# Patient Record
Sex: Female | Born: 1935 | Race: White | Hispanic: No | Marital: Married | Smoking: Never smoker
Health system: Southern US, Community
[De-identification: ages and names within clinical notes are randomized; demographics above are authoritative.]

## PROBLEM LIST (undated history)

## (undated) DIAGNOSIS — I350 Nonrheumatic aortic (valve) stenosis: Secondary | ICD-10-CM

## (undated) DIAGNOSIS — I480 Paroxysmal atrial fibrillation: Secondary | ICD-10-CM

## (undated) DIAGNOSIS — I2510 Atherosclerotic heart disease of native coronary artery without angina pectoris: Secondary | ICD-10-CM

## (undated) DIAGNOSIS — C50919 Malignant neoplasm of unspecified site of unspecified female breast: Secondary | ICD-10-CM

## (undated) DIAGNOSIS — I10 Essential (primary) hypertension: Secondary | ICD-10-CM

## (undated) HISTORY — DX: Atherosclerotic heart disease of native coronary artery without angina pectoris: I25.10

## (undated) HISTORY — DX: Paroxysmal atrial fibrillation: I48.0

## (undated) HISTORY — DX: Nonrheumatic aortic (valve) stenosis: I35.0

## (undated) HISTORY — PX: BREAST SURGERY: SHX581

## (undated) HISTORY — DX: Malignant neoplasm of unspecified site of unspecified female breast: C50.919

---

## 2014-01-16 ENCOUNTER — Inpatient Hospital Stay (HOSPITAL_COMMUNITY): Payer: No Typology Code available for payment source

## 2014-01-16 ENCOUNTER — Emergency Department (HOSPITAL_COMMUNITY): Payer: No Typology Code available for payment source

## 2014-01-16 ENCOUNTER — Encounter (HOSPITAL_COMMUNITY): Payer: Self-pay | Admitting: Emergency Medicine

## 2014-01-16 ENCOUNTER — Inpatient Hospital Stay (HOSPITAL_COMMUNITY)
Admission: EM | Admit: 2014-01-16 | Discharge: 2014-01-23 | DRG: 183 | Disposition: A | Discharge status: Home / Self Care | Payer: No Typology Code available for payment source | Attending: General Surgery | Admitting: General Surgery

## 2014-01-16 DIAGNOSIS — S272XXA Traumatic hemopneumothorax, initial encounter: Secondary | ICD-10-CM | POA: Diagnosis present

## 2014-01-16 DIAGNOSIS — S2249XA Multiple fractures of ribs, unspecified side, initial encounter for closed fracture: Principal | ICD-10-CM

## 2014-01-16 DIAGNOSIS — T07XXXA Unspecified multiple injuries, initial encounter: Secondary | ICD-10-CM | POA: Diagnosis present

## 2014-01-16 DIAGNOSIS — I10 Essential (primary) hypertension: Secondary | ICD-10-CM | POA: Diagnosis present

## 2014-01-16 DIAGNOSIS — R079 Chest pain, unspecified: Secondary | ICD-10-CM | POA: Diagnosis present

## 2014-01-16 DIAGNOSIS — S2242XA Multiple fractures of ribs, left side, initial encounter for closed fracture: Secondary | ICD-10-CM

## 2014-01-16 DIAGNOSIS — I2510 Atherosclerotic heart disease of native coronary artery without angina pectoris: Secondary | ICD-10-CM

## 2014-01-16 DIAGNOSIS — J939 Pneumothorax, unspecified: Secondary | ICD-10-CM

## 2014-01-16 DIAGNOSIS — E039 Hypothyroidism, unspecified: Secondary | ICD-10-CM | POA: Diagnosis present

## 2014-01-16 DIAGNOSIS — J811 Chronic pulmonary edema: Secondary | ICD-10-CM | POA: Diagnosis present

## 2014-01-16 DIAGNOSIS — Z79899 Other long term (current) drug therapy: Secondary | ICD-10-CM

## 2014-01-16 DIAGNOSIS — S2220XA Unspecified fracture of sternum, initial encounter for closed fracture: Secondary | ICD-10-CM

## 2014-01-16 DIAGNOSIS — D62 Acute posthemorrhagic anemia: Secondary | ICD-10-CM | POA: Diagnosis present

## 2014-01-16 DIAGNOSIS — S2241XA Multiple fractures of ribs, right side, initial encounter for closed fracture: Secondary | ICD-10-CM

## 2014-01-16 DIAGNOSIS — Y92410 Unspecified street and highway as the place of occurrence of the external cause: Secondary | ICD-10-CM

## 2014-01-16 DIAGNOSIS — E871 Hypo-osmolality and hyponatremia: Secondary | ICD-10-CM | POA: Diagnosis not present

## 2014-01-16 DIAGNOSIS — I4819 Other persistent atrial fibrillation: Secondary | ICD-10-CM

## 2014-01-16 DIAGNOSIS — Z7901 Long term (current) use of anticoagulants: Secondary | ICD-10-CM | POA: Diagnosis not present

## 2014-01-16 DIAGNOSIS — S2232XA Fracture of one rib, left side, initial encounter for closed fracture: Secondary | ICD-10-CM

## 2014-01-16 DIAGNOSIS — I959 Hypotension, unspecified: Secondary | ICD-10-CM | POA: Diagnosis present

## 2014-01-16 DIAGNOSIS — I4891 Unspecified atrial fibrillation: Secondary | ICD-10-CM | POA: Diagnosis present

## 2014-01-16 DIAGNOSIS — I359 Nonrheumatic aortic valve disorder, unspecified: Secondary | ICD-10-CM | POA: Diagnosis present

## 2014-01-16 DIAGNOSIS — J942 Hemothorax: Secondary | ICD-10-CM

## 2014-01-16 DIAGNOSIS — I482 Chronic atrial fibrillation, unspecified: Secondary | ICD-10-CM

## 2014-01-16 DIAGNOSIS — S271XXA Traumatic hemothorax, initial encounter: Secondary | ICD-10-CM

## 2014-01-16 DIAGNOSIS — S2239XA Fracture of one rib, unspecified side, initial encounter for closed fracture: Secondary | ICD-10-CM

## 2014-01-16 DIAGNOSIS — I350 Nonrheumatic aortic (valve) stenosis: Secondary | ICD-10-CM | POA: Diagnosis present

## 2014-01-16 HISTORY — DX: Essential (primary) hypertension: I10

## 2014-01-16 LAB — ETHANOL: Alcohol, Ethyl (B): <11 mg/dL (ref 0–11)

## 2014-01-16 LAB — PROTIME-INR
INR: 2.06 — ABNORMAL HIGH (ref 0.00–1.49)
Prothrombin Time: 23.2 seconds — ABNORMAL HIGH (ref 11.6–15.2)

## 2014-01-16 LAB — CBC
HCT: 28.5 % — ABNORMAL LOW (ref 36.0–46.0)
HEMATOCRIT: 28.3 % — AB (ref 36.0–46.0)
HEMOGLOBIN: 9.3 g/dL — AB (ref 12.0–15.0)
Hemoglobin: 9.3 g/dL — ABNORMAL LOW (ref 12.0–15.0)
MCH: 28.8 pg (ref 26.0–34.0)
MCH: 28.8 pg (ref 26.0–34.0)
MCHC: 32.9 g/dL (ref 30.0–36.0)
MCHC: 32.6 g/dL (ref 30.0–36.0)
MCV: 87.6 fL (ref 78.0–100.0)
MCV: 88.2 fL (ref 78.0–100.0)
PLATELETS: 345 10*3/uL (ref 150–400)
Platelets: 372 10*3/uL (ref 150–400)
RBC: 3.23 MIL/uL — AB (ref 3.87–5.11)
RBC: 3.23 MIL/uL — ABNORMAL LOW (ref 3.87–5.11)
RDW: 14.3 % (ref 11.5–15.5)
RDW: 14.4 % (ref 11.5–15.5)
WBC: 18 10*3/uL — ABNORMAL HIGH (ref 4.0–10.5)
WBC: 17.4 10*3/uL — AB (ref 4.0–10.5)

## 2014-01-16 LAB — COMPREHENSIVE METABOLIC PANEL
ALBUMIN: 3.3 g/dL — AB (ref 3.5–5.2)
ALT: 22 U/L (ref 0–35)
AST: 31 U/L (ref 0–37)
Alkaline Phosphatase: 90 U/L (ref 39–117)
Anion gap: 17 — ABNORMAL HIGH (ref 5–15)
BUN: 28 mg/dL — ABNORMAL HIGH (ref 6–23)
CO2: 20 meq/L (ref 19–32)
Calcium: 8.9 mg/dL (ref 8.4–10.5)
Chloride: 95 mEq/L — ABNORMAL LOW (ref 96–112)
Creatinine, Ser: 1.15 mg/dL — ABNORMAL HIGH (ref 0.50–1.10)
GFR calc Af Amer: 52 mL/min — ABNORMAL LOW (ref >90)
GFR calc non Af Amer: 45 mL/min — ABNORMAL LOW (ref >90)
Glucose, Bld: 110 mg/dL — ABNORMAL HIGH (ref 70–99)
Potassium: 5 mEq/L (ref 3.7–5.3)
Sodium: 132 mEq/L — ABNORMAL LOW (ref 137–147)
TOTAL PROTEIN: 6.4 g/dL (ref 6.0–8.3)
Total Bilirubin: 0.4 mg/dL (ref 0.3–1.2)

## 2014-01-16 LAB — MRSA PCR SCREENING: MRSA by PCR: NEGATIVE

## 2014-01-16 LAB — CDS SEROLOGY

## 2014-01-16 LAB — SAMPLE TO BLOOD BANK

## 2014-01-16 LAB — I-STAT CG4 LACTIC ACID, ED: Lactic Acid, Venous: 1.39 mmol/L (ref 0.5–2.2)

## 2014-01-16 MED ORDER — KCL IN DEXTROSE-NACL 20-5-0.45 MEQ/L-%-% IV SOLN
INTRAVENOUS | Status: DC
  Administered 2014-01-16 – 2014-01-17 (×2): via INTRAVENOUS
  Filled 2014-01-16 (×5): qty 1000

## 2014-01-16 MED ORDER — TIZANIDINE HCL 4 MG PO TABS
4.0000 mg | ORAL_TABLET | Freq: Every evening | ORAL | Status: DC | PRN
  Filled 2014-01-16: qty 1

## 2014-01-16 MED ORDER — OXYCODONE HCL 5 MG PO TABS
5.0000 mg | ORAL_TABLET | ORAL | Status: DC | PRN
  Administered 2014-01-16 – 2014-01-17 (×3): 5 mg via ORAL
  Filled 2014-01-16 (×3): qty 1

## 2014-01-16 MED ORDER — LETROZOLE 2.5 MG PO TABS
2.5000 mg | ORAL_TABLET | Freq: Every day | ORAL | Status: DC
  Administered 2014-01-17 – 2014-01-23 (×7): 2.5 mg via ORAL
  Filled 2014-01-16 (×7): qty 1

## 2014-01-16 MED ORDER — FUROSEMIDE 40 MG PO TABS
40.0000 mg | ORAL_TABLET | Freq: Every day | ORAL | Status: DC
  Administered 2014-01-17 – 2014-01-20 (×4): 40 mg via ORAL
  Filled 2014-01-16 (×3): qty 2
  Filled 2014-01-16: qty 1
  Filled 2014-01-16: qty 2

## 2014-01-16 MED ORDER — LEVOTHYROXINE SODIUM 100 MCG PO TABS
100.0000 ug | ORAL_TABLET | Freq: Every day | ORAL | Status: DC
  Administered 2014-01-17 – 2014-01-23 (×7): 100 ug via ORAL
  Filled 2014-01-16 (×9): qty 1

## 2014-01-16 MED ORDER — BIOTENE DRY MOUTH MT LIQD
15.0000 mL | Freq: Two times a day (BID) | OROMUCOSAL | Status: DC
  Administered 2014-01-16 – 2014-01-23 (×12): 15 mL via OROMUCOSAL

## 2014-01-16 MED ORDER — ATORVASTATIN CALCIUM 40 MG PO TABS
40.0000 mg | ORAL_TABLET | Freq: Every day | ORAL | Status: DC
  Administered 2014-01-17 – 2014-01-22 (×6): 40 mg via ORAL
  Filled 2014-01-16 (×9): qty 1

## 2014-01-16 MED ORDER — AMIODARONE HCL 100 MG PO TABS
100.0000 mg | ORAL_TABLET | Freq: Every day | ORAL | Status: DC
  Administered 2014-01-17 – 2014-01-18 (×2): 100 mg via ORAL
  Administered 2014-01-19: 50 mg via ORAL
  Filled 2014-01-16 (×3): qty 1

## 2014-01-16 MED ORDER — ONDANSETRON HCL 4 MG/2ML IJ SOLN: 4.0000 mg | Freq: Four times a day (QID) | INTRAMUSCULAR | Status: DC | PRN

## 2014-01-16 MED ORDER — GUAIFENESIN ER 600 MG PO TB12
600.0000 mg | ORAL_TABLET | Freq: Every day | ORAL | Status: DC | PRN
  Administered 2014-01-16: 600 mg via ORAL
  Filled 2014-01-16 (×2): qty 1

## 2014-01-16 MED ORDER — PANTOPRAZOLE SODIUM 40 MG PO TBEC
40.0000 mg | DELAYED_RELEASE_TABLET | Freq: Every day | ORAL | Status: DC
  Administered 2014-01-18 – 2014-01-23 (×6): 40 mg via ORAL
  Filled 2014-01-16 (×5): qty 1

## 2014-01-16 MED ORDER — HYDROMORPHONE HCL PF 1 MG/ML IJ SOLN: 0.5000 mg | INTRAMUSCULAR | Status: DC | PRN

## 2014-01-16 MED ORDER — ZOLPIDEM TARTRATE 5 MG PO TABS: 5.0000 mg | ORAL_TABLET | Freq: Once | ORAL | Status: DC

## 2014-01-16 MED ORDER — DILTIAZEM HCL ER COATED BEADS 360 MG PO CP24
360.0000 mg | ORAL_CAPSULE | Freq: Every day | ORAL | Status: DC
  Administered 2014-01-17 – 2014-01-20 (×4): 360 mg via ORAL
  Filled 2014-01-16 (×5): qty 1

## 2014-01-16 MED ORDER — IOHEXOL 300 MG/ML  SOLN
95.0000 mL | Freq: Once | INTRAMUSCULAR | Status: AC | PRN
  Administered 2014-01-16: 95 mL via INTRAVENOUS

## 2014-01-16 MED ORDER — ONDANSETRON HCL 4 MG PO TABS: 4.0000 mg | ORAL_TABLET | Freq: Four times a day (QID) | ORAL | Status: DC | PRN

## 2014-01-16 MED ORDER — PANTOPRAZOLE SODIUM 40 MG IV SOLR
40.0000 mg | Freq: Every day | INTRAVENOUS | Status: DC
  Administered 2014-01-16 – 2014-01-17 (×2): 40 mg via INTRAVENOUS
  Filled 2014-01-16 (×2): qty 40
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
## 2014-01-16 NOTE — ED Notes (Signed)
Dr Zavitz at bedside  

## 2014-01-16 NOTE — ED Notes (Signed)
Pt returned from radiology.

## 2014-01-17 ENCOUNTER — Inpatient Hospital Stay (HOSPITAL_COMMUNITY): Payer: No Typology Code available for payment source

## 2014-01-17 DIAGNOSIS — I359 Nonrheumatic aortic valve disorder, unspecified: Secondary | ICD-10-CM

## 2014-01-17 DIAGNOSIS — I2510 Atherosclerotic heart disease of native coronary artery without angina pectoris: Secondary | ICD-10-CM

## 2014-01-17 LAB — HEMOGLOBIN AND HEMATOCRIT, BLOOD
HCT: 23.4 % — ABNORMAL LOW (ref 36.0–46.0)
Hemoglobin: 7.6 g/dL — ABNORMAL LOW (ref 12.0–15.0)

## 2014-01-17 LAB — BASIC METABOLIC PANEL
ANION GAP: 15 (ref 5–15)
Anion gap: 12 (ref 5–15)
BUN: 26 mg/dL — AB (ref 6–23)
BUN: 28 mg/dL — ABNORMAL HIGH (ref 6–23)
CALCIUM: 8.3 mg/dL — AB (ref 8.4–10.5)
CHLORIDE: 94 meq/L — AB (ref 96–112)
CHLORIDE: 97 meq/L (ref 96–112)
CO2: 19 meq/L (ref 19–32)
CO2: 19 meq/L (ref 19–32)
CREATININE: 0.89 mg/dL (ref 0.50–1.10)
Calcium: 7.5 mg/dL — ABNORMAL LOW (ref 8.4–10.5)
Creatinine, Ser: 0.98 mg/dL (ref 0.50–1.10)
GFR calc Af Amer: 71 mL/min — ABNORMAL LOW (ref >90)
GFR calc Af Amer: 63 mL/min — ABNORMAL LOW (ref >90)
GFR calc non Af Amer: 61 mL/min — ABNORMAL LOW (ref >90)
GFR calc non Af Amer: 55 mL/min — ABNORMAL LOW (ref >90)
Glucose, Bld: 542 mg/dL — ABNORMAL HIGH (ref 70–99)
Glucose, Bld: 124 mg/dL — ABNORMAL HIGH (ref 70–99)
Potassium: 7.2 mEq/L (ref 3.7–5.3)
Potassium: 5.4 mEq/L — ABNORMAL HIGH (ref 3.7–5.3)
SODIUM: 131 meq/L — AB (ref 137–147)
Sodium: 125 mEq/L — ABNORMAL LOW (ref 137–147)

## 2014-01-17 LAB — CBC
HCT: 26.3 % — ABNORMAL LOW (ref 36.0–46.0)
HEMATOCRIT: 22.9 % — AB (ref 36.0–46.0)
Hemoglobin: 7.5 g/dL — ABNORMAL LOW (ref 12.0–15.0)
Hemoglobin: 8.7 g/dL — ABNORMAL LOW (ref 12.0–15.0)
MCH: 28.3 pg (ref 26.0–34.0)
MCH: 29.1 pg (ref 26.0–34.0)
MCHC: 32.8 g/dL (ref 30.0–36.0)
MCHC: 33.1 g/dL (ref 30.0–36.0)
MCV: 86.4 fL (ref 78.0–100.0)
MCV: 88 fL (ref 78.0–100.0)
PLATELETS: 315 10*3/uL (ref 150–400)
Platelets: 308 10*3/uL (ref 150–400)
RBC: 2.65 MIL/uL — AB (ref 3.87–5.11)
RBC: 2.99 MIL/uL — ABNORMAL LOW (ref 3.87–5.11)
RDW: 14.5 % (ref 11.5–15.5)
RDW: 14.5 % (ref 11.5–15.5)
WBC: 8.2 10*3/uL (ref 4.0–10.5)
WBC: 7.9 10*3/uL (ref 4.0–10.5)

## 2014-01-17 MED ORDER — FUROSEMIDE 10 MG/ML IJ SOLN
20.0000 mg | Freq: Once | INTRAMUSCULAR | Status: AC
  Administered 2014-01-17: 20 mg via INTRAVENOUS
  Filled 2014-01-17: qty 2

## 2014-01-17 MED ORDER — DIPHENHYDRAMINE HCL 12.5 MG/5ML PO LIQD
12.5000 mg | ORAL | Status: DC | PRN
  Administered 2014-01-18: 12.5 mg via ORAL
  Filled 2014-01-17: qty 5

## 2014-01-17 MED ORDER — OXYCODONE HCL 5 MG PO TABS
5.0000 mg | ORAL_TABLET | ORAL | Status: DC | PRN
  Administered 2014-01-17: 10 mg via ORAL
  Administered 2014-01-17: 5 mg via ORAL
  Administered 2014-01-18 – 2014-01-19 (×4): 10 mg via ORAL
  Administered 2014-01-19: 5 mg via ORAL
  Administered 2014-01-20 – 2014-01-21 (×5): 10 mg via ORAL
  Administered 2014-01-22: 5 mg via ORAL
  Administered 2014-01-22: 10 mg via ORAL
  Administered 2014-01-22: 5 mg via ORAL
  Administered 2014-01-23 (×2): 10 mg via ORAL
  Filled 2014-01-17: qty 1
  Filled 2014-01-17 (×5): qty 2
  Filled 2014-01-17: qty 1
  Filled 2014-01-17: qty 2
  Filled 2014-01-17: qty 1
  Filled 2014-01-17 (×3): qty 2
  Filled 2014-01-17: qty 1
  Filled 2014-01-17 (×4): qty 2

## 2014-01-17 MED ORDER — IPRATROPIUM-ALBUTEROL 0.5-2.5 (3) MG/3ML IN SOLN: 3.0000 mL | RESPIRATORY_TRACT | Status: DC

## 2014-01-17 MED ORDER — IPRATROPIUM-ALBUTEROL 0.5-2.5 (3) MG/3ML IN SOLN: 3.0000 mL | RESPIRATORY_TRACT | Status: DC | PRN

## 2014-01-17 MED ORDER — GUAIFENESIN ER 600 MG PO TB12
1200.0000 mg | ORAL_TABLET | Freq: Two times a day (BID) | ORAL | Status: DC
  Administered 2014-01-17 – 2014-01-23 (×13): 1200 mg via ORAL
  Filled 2014-01-17 (×15): qty 2

## 2014-01-17 NOTE — Progress Notes (Signed)
CRITICAL VALUE ALERT  Critical value received:  Potassium  Date of notification:  01/17/14  Time of notification:  7076  Critical value read back:Yes.    Nurse who received alert:  Archie Endo, RN  MD notified (1st page):  Hulen Skains  Time of first page:  [phone]  Responding MD:  Hulen Skains  Time MD responded:  0408  Orders received.  Will continue to monitor pt closely.

## 2014-01-17 NOTE — Progress Notes (Signed)
UR completed.  Sherryl Valido, RN BSN [facility] CCM Trauma/Neuro ICU Case Manager [phone]  
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
## 2014-01-18 ENCOUNTER — Inpatient Hospital Stay (HOSPITAL_COMMUNITY): Payer: No Typology Code available for payment source

## 2014-01-18 LAB — CBC
HCT: 25.1 % — ABNORMAL LOW (ref 36.0–46.0)
HEMOGLOBIN: 8.2 g/dL — AB (ref 12.0–15.0)
MCH: 28.2 pg (ref 26.0–34.0)
MCHC: 32.7 g/dL (ref 30.0–36.0)
MCV: 86.3 fL (ref 78.0–100.0)
Platelets: 303 10*3/uL (ref 150–400)
RBC: 2.91 MIL/uL — AB (ref 3.87–5.11)
RDW: 14.4 % (ref 11.5–15.5)
WBC: 8.2 10*3/uL (ref 4.0–10.5)

## 2014-01-18 MED ORDER — DIPHENHYDRAMINE HCL 12.5 MG/5ML PO ELIX
12.5000 mg | ORAL_SOLUTION | Freq: Four times a day (QID) | ORAL | Status: DC | PRN
  Administered 2014-01-18 – 2014-01-21 (×2): 12.5 mg via ORAL
  Filled 2014-01-18: qty 10
  Filled 2014-01-18: qty 5
  Filled 2014-01-18: qty 10

## 2014-01-18 MED ORDER — DIPHENHYDRAMINE HCL 50 MG/ML IJ SOLN
INTRAMUSCULAR | Status: AC
  Administered 2014-01-18: 12.5 mg via INTRAVENOUS
  Filled 2014-01-18: qty 1

## 2014-01-18 MED ORDER — DIPHENHYDRAMINE HCL 50 MG/ML IJ SOLN
12.5000 mg | Freq: Four times a day (QID) | INTRAMUSCULAR | Status: DC | PRN
  Administered 2014-01-18: 12.5 mg via INTRAVENOUS

## 2014-01-18 MED ORDER — CAMPHOR-MENTHOL 0.5-0.5 % EX LOTN
TOPICAL_LOTION | CUTANEOUS | Status: DC | PRN
  Filled 2014-01-18: qty 222

## 2014-01-18 MED ORDER — FUROSEMIDE 10 MG/ML IJ SOLN
40.0000 mg | Freq: Once | INTRAMUSCULAR | Status: AC
  Administered 2014-01-18: 40 mg via INTRAVENOUS
  Filled 2014-01-18: qty 4
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
## 2014-01-18 NOTE — Progress Notes (Signed)
OT Cancellation Note  Patient Details Name: Kristen Gutierrez MRN: [id number] DOB: 03/22/1936   Cancelled Treatment:    Reason Eval/Treat Not Completed: Patient declined, no reason specified (reports too fatigue, family reports confusion). OT to reattempt as appropriate.   Peri Maris Pager: [phone]  01/18/2014, 1:38 PM

## 2014-01-19 ENCOUNTER — Inpatient Hospital Stay (HOSPITAL_COMMUNITY): Payer: No Typology Code available for payment source

## 2014-01-19 DIAGNOSIS — S2249XA Multiple fractures of ribs, unspecified side, initial encounter for closed fracture: Secondary | ICD-10-CM | POA: Diagnosis not present

## 2014-01-19 DIAGNOSIS — R079 Chest pain, unspecified: Secondary | ICD-10-CM | POA: Diagnosis not present

## 2014-01-19 DIAGNOSIS — J9819 Other pulmonary collapse: Secondary | ICD-10-CM

## 2014-01-19 LAB — CBC
HCT: 23.2 % — ABNORMAL LOW (ref 36.0–46.0)
Hemoglobin: 7.5 g/dL — ABNORMAL LOW (ref 12.0–15.0)
MCH: 28 pg (ref 26.0–34.0)
MCHC: 32.3 g/dL (ref 30.0–36.0)
MCV: 86.6 fL (ref 78.0–100.0)
PLATELETS: 281 10*3/uL (ref 150–400)
RBC: 2.68 MIL/uL — ABNORMAL LOW (ref 3.87–5.11)
RDW: 14.4 % (ref 11.5–15.5)
WBC: 11.9 10*3/uL — AB (ref 4.0–10.5)

## 2014-01-19 LAB — ABO/RH: ABO/RH(D): O POS

## 2014-01-19 LAB — BASIC METABOLIC PANEL
ANION GAP: 17 — AB (ref 5–15)
BUN: 40 mg/dL — ABNORMAL HIGH (ref 6–23)
CHLORIDE: 89 meq/L — AB (ref 96–112)
CO2: 19 mEq/L (ref 19–32)
Calcium: 8.3 mg/dL — ABNORMAL LOW (ref 8.4–10.5)
Creatinine, Ser: 1.32 mg/dL — ABNORMAL HIGH (ref 0.50–1.10)
GFR calc non Af Amer: 38 mL/min — ABNORMAL LOW (ref >90)
GFR, EST AFRICAN AMERICAN: 44 mL/min — AB (ref >90)
Glucose, Bld: 99 mg/dL (ref 70–99)
POTASSIUM: 5.4 meq/L — AB (ref 3.7–5.3)
Sodium: 125 mEq/L — ABNORMAL LOW (ref 137–147)

## 2014-01-19 LAB — PREPARE RBC (CROSSMATCH)

## 2014-01-19 LAB — PRO B NATRIURETIC PEPTIDE: PRO B NATRI PEPTIDE: 2517 pg/mL — AB (ref 0–450)

## 2014-01-19 MED ORDER — FUROSEMIDE 10 MG/ML IJ SOLN
20.0000 mg | Freq: Once | INTRAMUSCULAR | Status: AC
  Administered 2014-01-19: 20 mg via INTRAVENOUS
  Filled 2014-01-19: qty 2

## 2014-01-19 MED ORDER — AMIODARONE HCL 100 MG PO TABS
50.0000 mg | ORAL_TABLET | Freq: Two times a day (BID) | ORAL | Status: DC
  Administered 2014-01-19 – 2014-01-21 (×4): 50 mg via ORAL
  Filled 2014-01-19 (×7): qty 1
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
## 2014-01-19 NOTE — Progress Notes (Signed)
OT Cancellation Note  Patient Details Name: Kristen Gutierrez MRN: [id number] DOB: 12/23/1935   Cancelled Treatment:    Reason Eval/Treat Not Completed: Patient not medically ready (OT to hold until after one unit of blood )  Peri Maris Pager: [phone]  01/19/2014, 8:57 AM
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
## 2014-01-20 ENCOUNTER — Inpatient Hospital Stay (HOSPITAL_COMMUNITY): Payer: No Typology Code available for payment source

## 2014-01-20 DIAGNOSIS — E871 Hypo-osmolality and hyponatremia: Secondary | ICD-10-CM

## 2014-01-20 LAB — TYPE AND SCREEN
ABO/RH(D): O POS
ANTIBODY SCREEN: NEGATIVE
Unit division: 0

## 2014-01-20 LAB — CBC WITH DIFFERENTIAL/PLATELET
BASOS ABS: 0 10*3/uL (ref 0.0–0.1)
BASOS PCT: 0 % (ref 0–1)
Eosinophils Absolute: 0.1 10*3/uL (ref 0.0–0.7)
Eosinophils Relative: 1 % (ref 0–5)
HCT: 23.3 % — ABNORMAL LOW (ref 36.0–46.0)
Hemoglobin: 8 g/dL — ABNORMAL LOW (ref 12.0–15.0)
Lymphocytes Relative: 6 % — ABNORMAL LOW (ref 12–46)
Lymphs Abs: 0.5 10*3/uL — ABNORMAL LOW (ref 0.7–4.0)
MCH: 27.2 pg (ref 26.0–34.0)
MCHC: 34.3 g/dL (ref 30.0–36.0)
MCV: 79.3 fL (ref 78.0–100.0)
Monocytes Absolute: 1.1 10*3/uL — ABNORMAL HIGH (ref 0.1–1.0)
Monocytes Relative: 13 % — ABNORMAL HIGH (ref 3–12)
NEUTROS ABS: 6.9 10*3/uL (ref 1.7–7.7)
NEUTROS PCT: 80 % — AB (ref 43–77)
Platelets: 239 10*3/uL (ref 150–400)
RBC: 2.94 MIL/uL — ABNORMAL LOW (ref 3.87–5.11)
RDW: 19 % — AB (ref 11.5–15.5)
WBC: 8.6 10*3/uL (ref 4.0–10.5)

## 2014-01-20 LAB — BASIC METABOLIC PANEL
ANION GAP: 15 (ref 5–15)
BUN: 35 mg/dL — ABNORMAL HIGH (ref 6–23)
CO2: 21 mEq/L (ref 19–32)
Calcium: 8 mg/dL — ABNORMAL LOW (ref 8.4–10.5)
Chloride: 88 mEq/L — ABNORMAL LOW (ref 96–112)
Creatinine, Ser: 1.13 mg/dL — ABNORMAL HIGH (ref 0.50–1.10)
GFR calc non Af Amer: 46 mL/min — ABNORMAL LOW (ref >90)
GFR, EST AFRICAN AMERICAN: 53 mL/min — AB (ref >90)
Glucose, Bld: 95 mg/dL (ref 70–99)
POTASSIUM: 4.3 meq/L (ref 3.7–5.3)
Sodium: 124 mEq/L — ABNORMAL LOW (ref 137–147)

## 2014-01-20 MED ORDER — SALSALATE 500 MG PO TABS
1000.0000 mg | ORAL_TABLET | Freq: Three times a day (TID) | ORAL | Status: DC
  Administered 2014-01-20 (×2): 1000 mg via ORAL
  Filled 2014-01-20 (×5): qty 2

## 2014-01-20 NOTE — Progress Notes (Signed)
Report given to receiving RN on 6N, RN at bedside upon arrival, bedside report given and pt AAOX4 with GCS 15. No complaints of pain.

## 2014-01-20 NOTE — Progress Notes (Signed)
UR completed.  Forbes Loll, RN BSN [facility] CCM Trauma/Neuro ICU Case Manager [phone]  
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
## 2014-01-20 NOTE — Progress Notes (Signed)
Patient ID: Kristen Gutierrez, female   DOB: January 17, 1936, 78 y.o.   MRN: [id number]    Subjective: Pt feels ok.  Pain controlled with percocet.  Eating well.  Pulling 500-750 on IS.  Gets mild dyspnea with mobilization.  Objective: Vital signs in last 24 hours: Temp:  [98 F (36.7 C)-98.9 F (37.2 C)] 98.7 F (37.1 C) (07/17 0800) Pulse Rate:  [62-96] 82 (07/17 1000) Resp:  [14-24] 20 (07/17 1000) BP: (81-116)/(43-80) 105/67 mmHg (07/17 1000) SpO2:  [94 %-100 %] 98 % (07/17 1000) Last BM Date: 01/16/14  Intake/Output from previous day: 07/16 0701 - 07/17 0700 In: 1610 [P.O.:1020; I.V.:240; Blood:350] Out: 905 [Urine:905] Intake/Output this shift: Total I/O In: 180 [P.O.:150; I.V.:30] Out: -   PE: Gen: sitting up in bed in NAD Heart: regular Lungs: CTAB, but decreased breath sounds throughout both bases, O2 in place  Lab Results:   Recent Labs  01/19/14 0304 01/20/14 0315  WBC 11.9* 8.6  HGB 7.5* 8.0*  HCT 23.2* 23.3*  PLT 281 239   BMET  Recent Labs  01/19/14 0304 01/20/14 0315  NA 125* 124*  K 5.4* 4.3  CL 89* 88*  CO2 19 21  GLUCOSE 99 95  BUN 40* 35*  CREATININE 1.32* 1.13*  CALCIUM 8.3* 8.0*   PT/INR No results found for this basename: LABPROT, INR,  in the last 72 hours CMP     Component Value Date/Time   NA 124* 01/20/2014 0315   K 4.3 01/20/2014 0315   CL 88* 01/20/2014 0315   CO2 21 01/20/2014 0315   GLUCOSE 95 01/20/2014 0315   BUN 35* 01/20/2014 0315   CREATININE 1.13* 01/20/2014 0315   CALCIUM 8.0* 01/20/2014 0315   PROT 6.4 01/16/2014 1735   ALBUMIN 3.3* 01/16/2014 1735   AST 31 01/16/2014 1735   ALT 22 01/16/2014 1735   ALKPHOS 90 01/16/2014 1735   BILITOT 0.4 01/16/2014 1735   GFRNONAA 46* 01/20/2014 0315   GFRAA 53* 01/20/2014 0315   Lipase  No results found for this basename: lipase       Studies/Results: Dg Chest Port 1 View  01/20/2014   CLINICAL DATA:  CHF, rib fractures  EXAM: PORTABLE CHEST - 1 VIEW  COMPARISON:  01/19/2014;  01/18/2014; chest CT - 01/16/2014  FINDINGS: Grossly unchanged enlarged cardiac silhouette and mediastinal contours with atherosclerotic plaque within the thoracic aorta and exuberant calcifications within the mitral valve annulus. The pulmonary vasculature remains indistinct with cephalization of flow. Grossly unchanged small bilateral effusions with associated bibasilar heterogeneous/consolidative opacities, left greater than right. No pneumothorax. Grossly unchanged bones including minimally displaced left lateral rib fractures. Surgical clips overlie the lateral aspect of the left breast.  IMPRESSION: 1. Grossly unchanged findings of pulmonary edema, small bilateral effusions and associated bibasilar opacities, left greater than right, atelectasis versus infiltrate. Further evaluation with a PA and lateral chest radiograph may be obtained as clinically indicated. 2. Grossly unchanged left lateral minimally displaced rib fractures.   Electronically Signed   By: Sandi Mariscal M.D.   On: 01/20/2014 08:08   Dg Chest Port 1 View  01/19/2014   CLINICAL DATA:  Left pulmonary contusion.  EXAM: PORTABLE CHEST - 1 VIEW  COMPARISON:  January 18, 2014.  FINDINGS: Stable cardiomegaly. No pneumothorax is noted. Stable left basilar opacity is noted most likely representing combination of pulmonary contusion, atelectasis and hemo thorax. Stable right basilar opacity is noted consistent with subsegmental atelectasis and pleural effusion.  IMPRESSION: Stable left basilar opacity is noted  as described above. Stable smaller right basilar opacity as well.   Electronically Signed   By: Sabino Dick M.D.   On: 01/19/2014 08:02    Anti-infectives: Anti-infectives   None       Assessment/Plan  1. Multiple B rib fxs 2. Sternal fx 3. Hyponatremia 4. B pleural effusions/pulmonary edema  Plan:  1. Will fluid restrict to 1L/day due to hyponatremia.  SLIV  Will start salt tablets at 1g TID. 2. Will also give lasix today for  mild fluid overload.  Repeat CXR tomorrow.  Do not see any clinical evidence of infection/infiltrate seen on CXR, suspect this is just atelectasis. 3. Transfer to the floor. 4. Cont IS and pulm toilet 5. PT/OT   LOS: 4 days    Layken Doenges E 01/20/2014, 11:30 AM Pager: [phone]

## 2014-01-20 NOTE — Progress Notes (Signed)
Fluid restriction and salt tablets.  Needs Lasix  This patient has been seen and I agree with the findings and treatment plan.  Kathryne Eriksson. Dahlia Bailiff, MD, [place] [phone] (pager) [phone] (direct pager) Trauma Surgeon
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
## 2014-01-21 ENCOUNTER — Inpatient Hospital Stay (HOSPITAL_COMMUNITY): Payer: No Typology Code available for payment source

## 2014-01-21 DIAGNOSIS — I959 Hypotension, unspecified: Secondary | ICD-10-CM

## 2014-01-21 DIAGNOSIS — I4891 Unspecified atrial fibrillation: Secondary | ICD-10-CM | POA: Diagnosis present

## 2014-01-21 MED ORDER — FUROSEMIDE 20 MG PO TABS
20.0000 mg | ORAL_TABLET | Freq: Every day | ORAL | Status: DC
  Administered 2014-01-22 – 2014-01-23 (×2): 20 mg via ORAL
  Filled 2014-01-21 (×3): qty 1

## 2014-01-21 MED ORDER — SODIUM CHLORIDE 1 G PO TABS
1.0000 g | ORAL_TABLET | Freq: Three times a day (TID) | ORAL | Status: DC
  Administered 2014-01-21 – 2014-01-23 (×7): 1 g via ORAL
  Filled 2014-01-21 (×9): qty 1

## 2014-01-21 MED ORDER — DILTIAZEM HCL 60 MG PO TABS
60.0000 mg | ORAL_TABLET | Freq: Four times a day (QID) | ORAL | Status: DC
  Administered 2014-01-21 – 2014-01-23 (×6): 60 mg via ORAL
  Filled 2014-01-21 (×11): qty 1
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
## 2014-01-21 NOTE — Progress Notes (Signed)
Sodium low at 124.  Modena Jansky, [place] notified.

## 2014-01-21 NOTE — Progress Notes (Signed)
Pt placed on telemetry #7 and verified with central monitoring.  A fib, rate 77-81.

## 2014-01-21 NOTE — Progress Notes (Signed)
I have seen and examined the pt and agree with PA-Jenning's progress note. Con't ambulation Will see if Cards can change Rxs to cause less itching

## 2014-01-22 DIAGNOSIS — I4891 Unspecified atrial fibrillation: Secondary | ICD-10-CM

## 2014-01-22 LAB — BASIC METABOLIC PANEL
ANION GAP: 15 (ref 5–15)
BUN: 28 mg/dL — ABNORMAL HIGH (ref 6–23)
CALCIUM: 8.2 mg/dL — AB (ref 8.4–10.5)
CO2: 22 meq/L (ref 19–32)
Chloride: 89 mEq/L — ABNORMAL LOW (ref 96–112)
Creatinine, Ser: 0.92 mg/dL (ref 0.50–1.10)
GFR calc Af Amer: 68 mL/min — ABNORMAL LOW (ref >90)
GFR, EST NON AFRICAN AMERICAN: 59 mL/min — AB (ref >90)
Glucose, Bld: 93 mg/dL (ref 70–99)
Potassium: 4.5 mEq/L (ref 3.7–5.3)
SODIUM: 126 meq/L — AB (ref 137–147)

## 2014-01-22 LAB — MAGNESIUM: MAGNESIUM: 2.2 mg/dL (ref 1.5–2.5)

## 2014-01-22 LAB — CBC
HCT: 27 % — ABNORMAL LOW (ref 36.0–46.0)
Hemoglobin: 8.9 g/dL — ABNORMAL LOW (ref 12.0–15.0)
MCH: 27.1 pg (ref 26.0–34.0)
MCHC: 33 g/dL (ref 30.0–36.0)
MCV: 82.3 fL (ref 78.0–100.0)
PLATELETS: 327 10*3/uL (ref 150–400)
RBC: 3.28 MIL/uL — AB (ref 3.87–5.11)
RDW: 18.5 % — ABNORMAL HIGH (ref 11.5–15.5)
WBC: 9.1 10*3/uL (ref 4.0–10.5)

## 2014-01-22 MED ORDER — DABIGATRAN ETEXILATE MESYLATE 150 MG PO CAPS
150.0000 mg | ORAL_CAPSULE | Freq: Two times a day (BID) | ORAL | Status: DC
  Administered 2014-01-22 – 2014-01-23 (×3): 150 mg via ORAL
  Filled 2014-01-22 (×4): qty 1
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
## 2014-01-22 NOTE — Progress Notes (Signed)
Patient seen and examined.  Agree with PA's note.  
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
## 2014-01-23 ENCOUNTER — Inpatient Hospital Stay (HOSPITAL_COMMUNITY): Payer: No Typology Code available for payment source

## 2014-01-23 DIAGNOSIS — I10 Essential (primary) hypertension: Secondary | ICD-10-CM | POA: Insufficient documentation

## 2014-01-23 DIAGNOSIS — E871 Hypo-osmolality and hyponatremia: Secondary | ICD-10-CM | POA: Diagnosis not present

## 2014-01-23 DIAGNOSIS — S2220XA Unspecified fracture of sternum, initial encounter for closed fracture: Secondary | ICD-10-CM

## 2014-01-23 DIAGNOSIS — S272XXA Traumatic hemopneumothorax, initial encounter: Secondary | ICD-10-CM

## 2014-01-23 DIAGNOSIS — I350 Nonrheumatic aortic (valve) stenosis: Secondary | ICD-10-CM | POA: Diagnosis present

## 2014-01-23 DIAGNOSIS — I2510 Atherosclerotic heart disease of native coronary artery without angina pectoris: Secondary | ICD-10-CM | POA: Insufficient documentation

## 2014-01-23 DIAGNOSIS — D62 Acute posthemorrhagic anemia: Secondary | ICD-10-CM | POA: Diagnosis present

## 2014-01-23 LAB — CBC
HCT: 25.2 % — ABNORMAL LOW (ref 36.0–46.0)
Hemoglobin: 8.3 g/dL — ABNORMAL LOW (ref 12.0–15.0)
MCH: 27 pg (ref 26.0–34.0)
MCHC: 32.9 g/dL (ref 30.0–36.0)
MCV: 82.1 fL (ref 78.0–100.0)
Platelets: 314 10*3/uL (ref 150–400)
RBC: 3.07 MIL/uL — ABNORMAL LOW (ref 3.87–5.11)
RDW: 18.2 % — ABNORMAL HIGH (ref 11.5–15.5)
WBC: 7.9 10*3/uL (ref 4.0–10.5)

## 2014-01-23 LAB — BASIC METABOLIC PANEL
Anion gap: 14 (ref 5–15)
BUN: 25 mg/dL — ABNORMAL HIGH (ref 6–23)
CO2: 23 mEq/L (ref 19–32)
Calcium: 7.8 mg/dL — ABNORMAL LOW (ref 8.4–10.5)
Chloride: 90 mEq/L — ABNORMAL LOW (ref 96–112)
Creatinine, Ser: 0.88 mg/dL (ref 0.50–1.10)
GFR calc non Af Amer: 62 mL/min — ABNORMAL LOW (ref >90)
GFR, EST AFRICAN AMERICAN: 72 mL/min — AB (ref >90)
Glucose, Bld: 96 mg/dL (ref 70–99)
POTASSIUM: 4.2 meq/L (ref 3.7–5.3)
SODIUM: 127 meq/L — AB (ref 137–147)

## 2014-01-23 LAB — PRO B NATRIURETIC PEPTIDE: PRO B NATRI PEPTIDE: 1582 pg/mL — AB (ref 0–450)

## 2014-01-23 MED ORDER — DILTIAZEM HCL ER COATED BEADS 240 MG PO CP24: 240.0000 mg | ORAL_CAPSULE | Freq: Every day | ORAL | Status: DC

## 2014-01-23 MED ORDER — OXYCODONE-ACETAMINOPHEN 5-325 MG PO TABS: 1.0000 | ORAL_TABLET | ORAL | Status: DC | PRN

## 2014-01-23 MED ORDER — SODIUM CHLORIDE 1 G PO TABS: 1.0000 g | ORAL_TABLET | Freq: Three times a day (TID) | ORAL | Status: DC

## 2014-01-23 NOTE — Progress Notes (Signed)
SATURATION QUALIFICATIONS: (This note is used to comply with regulatory documentation for home oxygen)  Patient Saturations on Room Air at Rest = 95%  Patient Saturations on Room Air while Ambulating = 74%  Patient Saturations on 4 Liters of oxygen while Ambulating = 93%  Please briefly explain why patient needs home oxygen:

## 2014-01-23 NOTE — Discharge Instructions (Signed)
Please limit your fluid intake to 1070ml/day.  No driving while taking oxycodone.
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
## 2014-01-23 NOTE — Progress Notes (Signed)
Spoke with patient and husband in room about home O2.  Pt also with orders for HHPT--pt has politely declined these for now.  She wishes to wait until after she has gotten home and followed up with [facility] before making further arrangements. She did ask for a Rx for a Rollator which will be provided to her along with a list of medical equipment supply stores.   RN has documented sats in chart for O2 qualification. Dr. Mertie Moores has agreed to be the MD of record for following the home O2.    Address and phone number listed in EPIC are correct.   Sandi Mariscal, RN BSN MHA CCM  Case Manager, Trauma Service/Unit 38M [phone]
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
## 2014-01-23 NOTE — Progress Notes (Signed)
Discharge home with oxygen. Home discahge instruction given, home needs arranged by the case manager. Home dischage instruction given, no questions verbalized.

## 2014-01-23 NOTE — Progress Notes (Signed)
Should be able to go home today with home O2.  This patient has been seen and I agree with the findings and treatment plan.  Kathryne Eriksson. Dahlia Bailiff, MD, [place] [phone] (pager) [phone] (direct pager) Trauma Surgeon

## 2014-01-23 NOTE — Progress Notes (Signed)
PT Cancellation Note  Patient Details Name: Kristen Gutierrez MRN: [id number] DOB: 17-Sep-1935   Cancelled Treatment:    Reason Eval/Treat Not Completed: Other (comment)  Politely declined PT, having just gotten back from walking;  Will follow up later today as time allows;  Otherwise, will follow up for PT tomorrow;   Thank you,  Roney Marion, PT  Acute Rehabilitation Services Pager [phone] Office [phone]     Roney Marion [facility] 01/23/2014, 10:25 AM

## 2014-01-24 ENCOUNTER — Telehealth (INDEPENDENT_AMBULATORY_CARE_PROVIDER_SITE_OTHER): Payer: Self-pay

## 2014-01-24 MED ORDER — DILTIAZEM HCL ER COATED BEADS 240 MG PO CP24: 240.0000 mg | ORAL_CAPSULE | Freq: Every day | ORAL | Status: DC

## 2014-01-24 MED ORDER — SODIUM CHLORIDE 1 G PO TABS
1.0000 g | ORAL_TABLET | Freq: Three times a day (TID) | ORAL | Status: DC
Start: 2014-01-24 — End: 2014-07-14

## 2014-01-24 NOTE — Telephone Encounter (Signed)
, °

## 2014-01-24 NOTE — Telephone Encounter (Signed)
FYI- Pt husband called requesting the prescriptions that were sent to [place] be switched to [facility] in [place]. I discontinued them and resent them to [place] for pt.

## 2014-01-25 ENCOUNTER — Telehealth (HOSPITAL_COMMUNITY): Payer: Self-pay

## 2014-01-25 NOTE — Telephone Encounter (Signed)
NA, unable to leave message

## 2014-01-27 ENCOUNTER — Ambulatory Visit (INDEPENDENT_AMBULATORY_CARE_PROVIDER_SITE_OTHER): Payer: Medicare (Managed Care) | Admitting: Physician Assistant

## 2014-01-27 ENCOUNTER — Encounter: Payer: Self-pay | Admitting: Physician Assistant

## 2014-01-27 VITALS — BP 93/72 | HR 87 | Ht 60.0 in | Wt 159.8 lb

## 2014-01-27 DIAGNOSIS — I350 Nonrheumatic aortic (valve) stenosis: Secondary | ICD-10-CM

## 2014-01-27 DIAGNOSIS — I2510 Atherosclerotic heart disease of native coronary artery without angina pectoris: Secondary | ICD-10-CM

## 2014-01-27 DIAGNOSIS — IMO0002 Reserved for concepts with insufficient information to code with codable children: Secondary | ICD-10-CM

## 2014-01-27 DIAGNOSIS — S2239XK Fracture of one rib, unspecified side, subsequent encounter for fracture with nonunion: Secondary | ICD-10-CM

## 2014-01-27 DIAGNOSIS — I359 Nonrheumatic aortic valve disorder, unspecified: Secondary | ICD-10-CM

## 2014-01-27 DIAGNOSIS — I4891 Unspecified atrial fibrillation: Secondary | ICD-10-CM

## 2014-01-27 DIAGNOSIS — I509 Heart failure, unspecified: Secondary | ICD-10-CM

## 2014-01-27 DIAGNOSIS — I10 Essential (primary) hypertension: Secondary | ICD-10-CM

## 2014-01-27 DIAGNOSIS — I4819 Other persistent atrial fibrillation: Secondary | ICD-10-CM

## 2014-01-27 MED ORDER — FUROSEMIDE 40 MG PO TABS: 40.0000 mg | ORAL_TABLET | ORAL | Status: DC
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
## 2014-01-27 NOTE — Patient Instructions (Signed)
TAKE LASIX 40 MG DAILY FOR THE NEXT 3 DAYS THEN TAKE ONLY AS NEEDED FOR SWELLING  Your physician recommends that you schedule a follow-up appointment in: 03/14/14 1:45 WITH DR. Acie Fredrickson   WE WILL GET RECORDS FROM [facility] [place], [place]

## 2014-02-02 ENCOUNTER — Telehealth: Payer: Self-pay | Admitting: Physician Assistant

## 2014-02-02 NOTE — Telephone Encounter (Signed)
ROI faxed to [facility] @ [phone]    02/02/14

## 2014-02-02 NOTE — Telephone Encounter (Signed)
Records rec From [facility] gave to [place] F  7.30.15/km

## 2014-02-03 ENCOUNTER — Encounter: Payer: Self-pay | Admitting: Physician Assistant

## 2014-03-14 ENCOUNTER — Ambulatory Visit: Payer: Medicare (Managed Care) | Admitting: Cardiovascular Disease

## 2014-05-23 ENCOUNTER — Other Ambulatory Visit (HOSPITAL_COMMUNITY): Payer: Self-pay

## 2014-05-23 ENCOUNTER — Inpatient Hospital Stay
Admission: AD | Admit: 2014-05-23 | Discharge: 2014-06-28 | Disposition: A | Discharge status: Home / Self Care | Payer: Medicare (Managed Care) | Source: Ambulatory Visit | Attending: Internal Medicine | Admitting: Internal Medicine

## 2014-05-23 DIAGNOSIS — J9811 Atelectasis: Secondary | ICD-10-CM | POA: Insufficient documentation

## 2014-05-23 DIAGNOSIS — J9690 Respiratory failure, unspecified, unspecified whether with hypoxia or hypercapnia: Secondary | ICD-10-CM

## 2014-05-23 DIAGNOSIS — J9611 Chronic respiratory failure with hypoxia: Secondary | ICD-10-CM | POA: Insufficient documentation

## 2014-05-23 DIAGNOSIS — I639 Cerebral infarction, unspecified: Secondary | ICD-10-CM | POA: Insufficient documentation

## 2014-05-23 DIAGNOSIS — I619 Nontraumatic intracerebral hemorrhage, unspecified: Secondary | ICD-10-CM | POA: Insufficient documentation

## 2014-05-23 DIAGNOSIS — K567 Ileus, unspecified: Secondary | ICD-10-CM

## 2014-05-23 DIAGNOSIS — Z931 Gastrostomy status: Secondary | ICD-10-CM | POA: Insufficient documentation

## 2014-05-23 DIAGNOSIS — J189 Pneumonia, unspecified organism: Secondary | ICD-10-CM

## 2014-05-23 DIAGNOSIS — T148XXA Other injury of unspecified body region, initial encounter: Secondary | ICD-10-CM

## 2014-05-23 DIAGNOSIS — Z930 Tracheostomy status: Secondary | ICD-10-CM

## 2014-05-23 DIAGNOSIS — J989 Respiratory disorder, unspecified: Secondary | ICD-10-CM

## 2014-05-23 DIAGNOSIS — J9620 Acute and chronic respiratory failure, unspecified whether with hypoxia or hypercapnia: Secondary | ICD-10-CM

## 2014-05-23 DIAGNOSIS — Z9911 Dependence on respirator [ventilator] status: Secondary | ICD-10-CM

## 2014-05-23 DIAGNOSIS — Z431 Encounter for attention to gastrostomy: Secondary | ICD-10-CM

## 2014-05-23 DIAGNOSIS — J984 Other disorders of lung: Secondary | ICD-10-CM | POA: Insufficient documentation

## 2014-05-23 LAB — URINALYSIS, ROUTINE W REFLEX MICROSCOPIC
Bilirubin Urine: NEGATIVE
Glucose, UA: NEGATIVE mg/dL
Ketones, ur: NEGATIVE mg/dL
Nitrite: POSITIVE — AB
Protein, ur: NEGATIVE mg/dL
Specific Gravity, Urine: 1.01 (ref 1.005–1.030)
UROBILINOGEN UA: 1 mg/dL (ref 0.0–1.0)
pH: 6 (ref 5.0–8.0)

## 2014-05-23 LAB — BLOOD GAS, ARTERIAL
Acid-Base Excess: 8.9 mmol/L — ABNORMAL HIGH (ref 0.0–2.0)
Bicarbonate: 33 mEq/L — ABNORMAL HIGH (ref 20.0–24.0)
FIO2: 0.4 %
O2 Saturation: 97.1 %
PCO2 ART: 45.6 mmHg — AB (ref 35.0–45.0)
PEEP/CPAP: 5 cmH2O
PH ART: 7.473 — AB (ref 7.350–7.450)
PO2 ART: 87.9 mmHg (ref 80.0–100.0)
Patient temperature: 98.6
Pressure support: 12 cmH2O
TCO2: 34.4 mmol/L (ref 0–100)

## 2014-05-23 LAB — URINE MICROSCOPIC-ADD ON

## 2014-05-24 DIAGNOSIS — J9620 Acute and chronic respiratory failure, unspecified whether with hypoxia or hypercapnia: Secondary | ICD-10-CM

## 2014-05-24 DIAGNOSIS — Z930 Tracheostomy status: Secondary | ICD-10-CM

## 2014-05-24 LAB — BASIC METABOLIC PANEL WITH GFR
Anion gap: 8 (ref 5–15)
BUN: 49 mg/dL — ABNORMAL HIGH (ref 6–23)
CO2: 34 meq/L — ABNORMAL HIGH (ref 19–32)
Calcium: 8.8 mg/dL (ref 8.4–10.5)
Chloride: 105 meq/L (ref 96–112)
Creatinine, Ser: 0.75 mg/dL (ref 0.50–1.10)
GFR calc Af Amer: >90 mL/min
GFR calc non Af Amer: 80 mL/min — ABNORMAL LOW
Glucose, Bld: 91 mg/dL (ref 70–99)
Potassium: 4.6 meq/L (ref 3.7–5.3)
Sodium: 147 meq/L (ref 137–147)

## 2014-05-24 LAB — CBC
HCT: 34 % — ABNORMAL LOW (ref 36.0–46.0)
Hemoglobin: 10.1 g/dL — ABNORMAL LOW (ref 12.0–15.0)
MCH: 27.8 pg (ref 26.0–34.0)
MCHC: 29.7 g/dL — ABNORMAL LOW (ref 30.0–36.0)
MCV: 93.7 fL (ref 78.0–100.0)
Platelets: 199 K/uL (ref 150–400)
RBC: 3.63 MIL/uL — ABNORMAL LOW (ref 3.87–5.11)
RDW: 21.6 % — ABNORMAL HIGH (ref 11.5–15.5)
WBC: 8.5 K/uL (ref 4.0–10.5)

## 2014-05-24 LAB — HEPATIC FUNCTION PANEL
ALBUMIN: 2.5 g/dL — AB (ref 3.5–5.2)
ALK PHOS: 134 U/L — AB (ref 39–117)
ALT: 25 U/L (ref 0–35)
AST: 33 U/L (ref 0–37)
BILIRUBIN TOTAL: 0.8 mg/dL (ref 0.3–1.2)
Bilirubin, Direct: 0.3 mg/dL (ref 0.0–0.3)
Indirect Bilirubin: 0.5 mg/dL (ref 0.3–0.9)
Total Protein: 5.7 g/dL — ABNORMAL LOW (ref 6.0–8.3)

## 2014-05-24 LAB — TSH: TSH: 7.75 u[IU]/mL — ABNORMAL HIGH (ref 0.350–4.500)

## 2014-05-24 LAB — PROTIME-INR
INR: 1.68 — ABNORMAL HIGH (ref 0.00–1.49)
Prothrombin Time: 19.9 seconds — ABNORMAL HIGH (ref 11.6–15.2)

## 2014-05-24 LAB — T4, FREE: Free T4: 1.83 ng/dL — ABNORMAL HIGH (ref 0.80–1.80)

## 2014-05-24 LAB — PRO B NATRIURETIC PEPTIDE: Pro B Natriuretic peptide (BNP): 11611 pg/mL — ABNORMAL HIGH (ref 0–450)

## 2014-05-24 LAB — DIGOXIN LEVEL: Digoxin Level: 0.8 ng/mL (ref 0.8–2.0)
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
## 2014-05-25 ENCOUNTER — Other Ambulatory Visit (HOSPITAL_COMMUNITY): Payer: Self-pay

## 2014-05-25 LAB — PROTIME-INR
INR: 1.5 — ABNORMAL HIGH (ref 0.00–1.49)
INR: 1.71 — ABNORMAL HIGH (ref 0.00–1.49)
PROTHROMBIN TIME: 20.2 s — AB (ref 11.6–15.2)
Prothrombin Time: 18.2 seconds — ABNORMAL HIGH (ref 11.6–15.2)

## 2014-05-25 NOTE — Progress Notes (Signed)
Name: Kristen Gutierrez MRN: [id number] DOB: March 29, 1936    ADMISSION DATE:  05/23/2014 CONSULTATION DATE:  11/18  REFERRING MD :  Laren Everts   CHIEF COMPLAINT:  Vent weaning   BRIEF PATIENT DESCRIPTION:  This is a 78 year old female who was admitted to [facility] for decompensated heart failure in the setting of known severe mitral and aortic valve stenosis. She eventually underwent mechanical aortic and mitral valve replacement on 10/16, as well as CABG x 2V. Post-op required IABP. Her hospital course was complicated by: SDH which was felt to be subacute and also felt to have acute thrombo-embolic event, hypoactive delirium, HCAP, and failure to wean. She had trach placed on 10/25. Was transferred to [facility] for weaning efforts.   SIGNIFICANT EVENTS    STUDIES:     HISTORY OF PRESENT ILLNESS:   This is a 78 year old female who was admitted to [facility] for decompensated heart failure in the setting of known severe mitral and aortic valve stenosis. She eventually underwent mechanical aortic and mitral valve replacement on 10/16, as well as CABG x 2V. Post-op required IABP. Her hospital course was complicated by: SDH which was felt to be subacute and also felt to have acute thrombo-embolic event, hypoactive delirium, HCAP, and failure to wean. She had trach placed on 10/25. Was transferred to [facility] for weaning efforts.   SUBJECTIVE:  No distress on vent VITAL SIGNS: Vital signs reviewed. Abnormal values will appear under impression plan section.   PHYSICAL EXAMINATION: General:  78 year old female, currently on PSV 12 ventilation with Vt 381 Neuro:  Awake, does not f/c. Right sided weakness. LE w/ equal st  HEENT:  # 6 trach unremarkable  Cardiovascular:  + audible click, regular irreg  Lungs:  Scattered rhonchi , decreased in bases Abdomen:  Soft, tf running Musculoskeletal:  Intact  Skin:  Multiple areas of ecchymosis     Recent Labs Lab 05/24/14 0500  NA 147  K 4.6  CL 105  CO2 34*  BUN  49*  CREATININE 0.75  GLUCOSE 91    Recent Labs Lab 05/24/14 0500  HGB 10.1*  HCT 34.0*  WBC 8.5  PLT 199      Dg Chest Port 1 View  05/23/2014   CLINICAL DATA:  Respiratory failure.  Prior ear thoracic trauma.  EXAM: PORTABLE CHEST - 1 VIEW  COMPARISON:  01/23/2014  FINDINGS: The tracheostomy tube is in good position. A right PICC line tip is in the mid distal SVC. The heart is enlarged but stable. There is tortuosity and calcification of the thoracic aorta. There is a persistent left lower lobe process which is likely a combination of effusion, atelectasis and or infiltrate. Persistent perihilar pulmonary edema and areas of atelectasis. Small right effusion. Left-sided rib fractures are again demonstrated.  IMPRESSION: Support apparatus in good position without complicating features.  Persistent left lower lobe process, small right effusion, vascular congestion, pulmonary edema and areas of atelectasis.   Electronically Signed   By: Kalman Jewels M.D.   On: 05/23/2014 16:34   Dg Abd Portable 1v  05/23/2014   CLINICAL DATA:  G-tube placement, G-tube feedings  EXAM: PORTABLE ABDOMEN - 1 VIEW  COMPARISON:  01/16/2014  FINDINGS: Gastrostomy tube projects over the stomach.  A jejunostomy tube through gastrostomy tube extends into proximal jejunal loops  Retained contrast present in colon.  Nonobstructive bowel gas pattern.  Atelectasis versus consolidation at lung bases.  Post CABG and AVR.  Diffuse osseous demineralization.  Scattered  atherosclerotic calcification.  IMPRESSION: Jejunostomy tube through gastrostomy tube with J-tube tip projecting at proximal jejunum.   Electronically Signed   By: Lavonia Dana M.D.   On: 05/23/2014 18:05      ASSESSMENT / PLAN: Ventilator dependent respiratory failure  Tracheostomy status Severe AS/MS s/p both mechanical AV and MV replacement on 10/16 CAD s/p 2V CABG 10/16 Afib Stroke Subacute left SDH Hypoactive delirium Hypotension:  on Midodrine    Recurrent Pleural effusions LLL airspace disease. Completed rx for HCAP Suspect mix of atx and effusion.  Dysphagia: has feeding tube  Hypothyroidism  Hypernatremia  Deconditioning   Discussion Should be able to come off vent. Already tolerating some time on ATC and using PMV. To early to comment on decannulation.   Plan Cont weaning protocol per [facility] Aggressive PT/OT and SLP support  PMV trials Will need to Take a look at the left chest w/ [place]. If sig fluid collection would consider therapeutic and diagnostic thoracentesis...  Defer anticoagulation and medical therapy to primary service.   Richardson Landry Minor ACNP Maryanna Shape PCCM Pager [phone] till 3 pm If no answer page [phone] 05/25/2014, 9:36 AM

## 2014-05-25 NOTE — Consult Note (Deleted)
Name: Tranise Forrest MRN: [id number] DOB: [birth year]    ADMISSION DATE:  05/23/2014 CONSULTATION DATE:  11/18  REFERRING MD :  Laren Everts   CHIEF COMPLAINT:  Vent weaning   BRIEF PATIENT DESCRIPTION:  This is a 78 year old female who was admitted to [facility] for decompensated heart failure in the setting of known severe mitral and aortic valve stenosis. She eventually underwent mechanical aortic and mitral valve replacement on 10/16, as well as CABG x 2V. Post-op required IABP. Her hospital course was complicated by: SDH which was felt to be subacute and also felt to have acute thrombo-embolic event, hypoactive delirium, HCAP, and failure to wean. She had trach placed on 10/25. Was transferred to [facility] for weaning efforts.   SIGNIFICANT EVENTS    STUDIES:     HISTORY OF PRESENT ILLNESS:   This is a 78 year old female who was admitted to [facility] for decompensated heart failure in the setting of known severe mitral and aortic valve stenosis. She eventually underwent mechanical aortic and mitral valve replacement on 10/16, as well as CABG x 2V. Post-op required IABP. Her hospital course was complicated by: SDH which was felt to be subacute and also felt to have acute thrombo-embolic event, hypoactive delirium, HCAP, and failure to wean. She had trach placed on 10/25. Was transferred to [facility] for weaning efforts.   SUBJECTIVE:  No distress on vent VITAL SIGNS: Vital signs reviewed. Abnormal values will appear under impression plan section.   PHYSICAL EXAMINATION: General:  78 year old female, currently on PSV 12 ventilation with Vt 381 Neuro:  Awake, does not f/c. Right sided weakness. LE w/ equal st  HEENT:  # 6 trach unremarkable  Cardiovascular:  + audible click, regular irreg  Lungs:  Scattered rhonchi , decreased in bases Abdomen:  Soft, tf running Musculoskeletal:  Intact  Skin:  Multiple areas of ecchymosis    Recent Labs Lab 05/24/14 0500  NA 147  K 4.6  CL 105  CO2 34*  BUN 49*   CREATININE 0.75  GLUCOSE 91    Recent Labs Lab 05/24/14 0500  HGB 10.1*  HCT 34.0*  WBC 8.5  PLT 199   Dg Chest Port 1 View  05/23/2014   CLINICAL DATA:  Respiratory failure.  Prior ear thoracic trauma.  EXAM: PORTABLE CHEST - 1 VIEW  COMPARISON:  01/23/2014  FINDINGS: The tracheostomy tube is in good position. A right PICC line tip is in the mid distal SVC. The heart is enlarged but stable. There is tortuosity and calcification of the thoracic aorta. There is a persistent left lower lobe process which is likely a combination of effusion, atelectasis and or infiltrate. Persistent perihilar pulmonary edema and areas of atelectasis. Small right effusion. Left-sided rib fractures are again demonstrated.  IMPRESSION: Support apparatus in good position without complicating features.  Persistent left lower lobe process, small right effusion, vascular congestion, pulmonary edema and areas of atelectasis.   Electronically Signed   By: Kalman Jewels M.D.   On: 05/23/2014 16:34   Dg Abd Portable 1v  05/23/2014   CLINICAL DATA:  G-tube placement, G-tube feedings  EXAM: PORTABLE ABDOMEN - 1 VIEW  COMPARISON:  01/16/2014  FINDINGS: Gastrostomy tube projects over the stomach.  A jejunostomy tube through gastrostomy tube extends into proximal jejunal loops  Retained contrast present in colon.  Nonobstructive bowel gas pattern.  Atelectasis versus consolidation at lung bases.  Post CABG and AVR.  Diffuse osseous demineralization.  Scattered atherosclerotic calcification.  IMPRESSION:  Jejunostomy tube through gastrostomy tube with J-tube tip projecting at proximal jejunum.   Electronically Signed   By: Lavonia Dana M.D.   On: 05/23/2014 18:05    ASSESSMENT / PLAN: Ventilator dependent respiratory failure  Tracheostomy status Severe AS/MS s/p both mechanical AV and MV replacement on 10/16 CAD s/p 2V CABG 10/16 Afib Stroke Subacute left SDH Hypoactive delirium Hypotension:  on Midodrine  Recurrent  Pleural effusions LLL airspace disease. Completed rx for HCAP Suspect mix of atx and effusion.  Dysphagia: has feeding tube  Hypothyroidism  Hypernatremia  Deconditioning   Discussion Should be able to come off vent. Already tolerating some time on ATC and using PMV. To early to comment on decannulation.   Plan Cont weaning protocol per [facility] Aggressive PT/OT and SLP support  PMV trials Will need to Take a look at the left chest w/ [place]. If sig fluid collection would consider therapeutic and diagnostic thoracentesis...  Defer anticoagulation and medical therapy to primary service.   Richardson Landry Orman Matsumura ACNP Maryanna Shape PCCM Pager [phone] till 3 pm If no answer page [phone] 05/25/2014, 9:36 AM  Pager # [phone] OR # [phone] if no answer  05/25/2014, 9:34 AM

## 2014-05-26 ENCOUNTER — Other Ambulatory Visit (HOSPITAL_COMMUNITY): Payer: Self-pay

## 2014-05-26 LAB — CBC
HEMATOCRIT: 31.9 % — AB (ref 36.0–46.0)
Hemoglobin: 9.7 g/dL — ABNORMAL LOW (ref 12.0–15.0)
MCH: 29 pg (ref 26.0–34.0)
MCHC: 30.4 g/dL (ref 30.0–36.0)
MCV: 95.2 fL (ref 78.0–100.0)
Platelets: 218 10*3/uL (ref 150–400)
RBC: 3.35 MIL/uL — AB (ref 3.87–5.11)
RDW: 21 % — ABNORMAL HIGH (ref 11.5–15.5)
WBC: 8.4 10*3/uL (ref 4.0–10.5)

## 2014-05-26 LAB — BASIC METABOLIC PANEL
Anion gap: 9 (ref 5–15)
BUN: 54 mg/dL — AB (ref 6–23)
CALCIUM: 8.5 mg/dL (ref 8.4–10.5)
CHLORIDE: 100 meq/L (ref 96–112)
CO2: 36 meq/L — AB (ref 19–32)
CREATININE: 0.71 mg/dL (ref 0.50–1.10)
GFR calc Af Amer: >90 mL/min (ref >90)
GFR calc non Af Amer: 82 mL/min — ABNORMAL LOW (ref >90)
Glucose, Bld: 100 mg/dL — ABNORMAL HIGH (ref 70–99)
Potassium: 4.2 mEq/L (ref 3.7–5.3)
Sodium: 145 mEq/L (ref 137–147)

## 2014-05-26 LAB — PROTIME-INR
INR: 1.61 — ABNORMAL HIGH (ref 0.00–1.49)
Prothrombin Time: 19.3 seconds — ABNORMAL HIGH (ref 11.6–15.2)

## 2014-05-27 LAB — PROTIME-INR
INR: 1.66 — ABNORMAL HIGH (ref 0.00–1.49)
Prothrombin Time: 19.8 seconds — ABNORMAL HIGH (ref 11.6–15.2)

## 2014-05-28 ENCOUNTER — Other Ambulatory Visit (HOSPITAL_COMMUNITY): Payer: Self-pay

## 2014-05-28 LAB — BASIC METABOLIC PANEL
Anion gap: 12 (ref 5–15)
BUN: 70 mg/dL — ABNORMAL HIGH (ref 6–23)
CO2: 37 meq/L — AB (ref 19–32)
Calcium: 8.5 mg/dL (ref 8.4–10.5)
Chloride: 95 mEq/L — ABNORMAL LOW (ref 96–112)
Creatinine, Ser: 0.86 mg/dL (ref 0.50–1.10)
GFR calc Af Amer: 74 mL/min — ABNORMAL LOW (ref >90)
GFR, EST NON AFRICAN AMERICAN: 64 mL/min — AB (ref >90)
GLUCOSE: 100 mg/dL — AB (ref 70–99)
POTASSIUM: 4 meq/L (ref 3.7–5.3)
Sodium: 144 mEq/L (ref 137–147)

## 2014-05-28 LAB — CBC
HCT: 29.4 % — ABNORMAL LOW (ref 36.0–46.0)
HEMOGLOBIN: 8.8 g/dL — AB (ref 12.0–15.0)
MCH: 27.8 pg (ref 26.0–34.0)
MCHC: 29.9 g/dL — ABNORMAL LOW (ref 30.0–36.0)
MCV: 93 fL (ref 78.0–100.0)
Platelets: 239 10*3/uL (ref 150–400)
RBC: 3.16 MIL/uL — AB (ref 3.87–5.11)
RDW: 21.4 % — ABNORMAL HIGH (ref 11.5–15.5)
WBC: 8.6 10*3/uL (ref 4.0–10.5)

## 2014-05-28 LAB — PROTIME-INR
INR: 1.51 — AB (ref 0.00–1.49)
Prothrombin Time: 18.4 seconds — ABNORMAL HIGH (ref 11.6–15.2)

## 2014-05-29 ENCOUNTER — Other Ambulatory Visit (HOSPITAL_COMMUNITY): Payer: Self-pay

## 2014-05-29 DIAGNOSIS — I619 Nontraumatic intracerebral hemorrhage, unspecified: Secondary | ICD-10-CM | POA: Insufficient documentation

## 2014-05-29 DIAGNOSIS — J9611 Chronic respiratory failure with hypoxia: Secondary | ICD-10-CM | POA: Insufficient documentation

## 2014-05-29 DIAGNOSIS — Z931 Gastrostomy status: Secondary | ICD-10-CM | POA: Insufficient documentation

## 2014-05-29 DIAGNOSIS — J9600 Acute respiratory failure, unspecified whether with hypoxia or hypercapnia: Secondary | ICD-10-CM

## 2014-05-29 DIAGNOSIS — J984 Other disorders of lung: Secondary | ICD-10-CM | POA: Insufficient documentation

## 2014-05-29 LAB — PROTIME-INR
INR: 1.52 — ABNORMAL HIGH (ref 0.00–1.49)
PROTHROMBIN TIME: 18.4 s — AB (ref 11.6–15.2)

## 2014-05-29 NOTE — Progress Notes (Signed)
Name: Kristen Gutierrez MRN: [id number] DOB: 1936/03/05    ADMISSION DATE:  05/23/2014 CONSULTATION DATE:  11/18  REFERRING MD :  Laren Everts   CHIEF COMPLAINT:  Vent weaning   BRIEF PATIENT DESCRIPTION:  This is a 78 year old female who was admitted to [facility] for decompensated heart failure in the setting of known severe mitral and aortic valve stenosis. She eventually underwent mechanical aortic and mitral valve replacement on 10/16, as well as CABG x 2V. Post-op required IABP. Her hospital course was complicated by: SDH which was felt to be subacute and also felt to have acute thrombo-embolic event, hypoactive delirium, HCAP, and failure to wean. She had trach placed on 10/25. Was transferred to [facility] for weaning efforts.   SIGNIFICANT EVENTS    STUDIES:     HISTORY OF PRESENT ILLNESS:   This is a 78 year old female who was admitted to [facility] for decompensated heart failure in the setting of known severe mitral and aortic valve stenosis. She eventually underwent mechanical aortic and mitral valve replacement on 10/16, as well as CABG x 2V. Post-op required IABP. Her hospital course was complicated by: SDH which was felt to be subacute and also felt to have acute thrombo-embolic event, hypoactive delirium, HCAP, and failure to wean. She had trach placed on 10/25. Was transferred to [facility] for weaning efforts.   SUBJECTIVE:  No distress on vent VITAL SIGNS: Vital signs reviewed. Abnormal values will appear under impression plan section.   PHYSICAL EXAMINATION: General:  78 year old female, currently on PSV 12 ventilation with Vt 381 Neuro:  Awake, does not f/c. Right sided weakness. LE w/ equal st  HEENT:  # 6 trach unremarkable  Cardiovascular:  + audible click, regular irreg  Lungs:  Scattered rhonchi , decreased in bases Abdomen:  Soft, tf running Musculoskeletal:  Intact  Skin:  Multiple areas of ecchymosis     Recent Labs Lab 05/24/14 0500 05/26/14 0540 05/28/14 0500  NA 147 145  144  K 4.6 4.2 4.0  CL 105 100 95*  CO2 34* 36* 37*  BUN 49* 54* 70*  CREATININE 0.75 0.71 0.86  GLUCOSE 91 100* 100*    Recent Labs Lab 05/24/14 0500 05/26/14 0540 05/28/14 0500  HGB 10.1* 9.7* 8.8*  HCT 34.0* 31.9* 29.4*  WBC 8.5 8.4 8.6  PLT 199 218 239      Dg Chest Port 1 View  05/29/2014   CLINICAL DATA:  Acute on chronic respiratory failure; history of aortic stenosis and coronary artery disease  EXAM: PORTABLE CHEST - 1 VIEW  COMPARISON:  Portable chest x-ray of May 28, 2014  FINDINGS: The lungs are adequately inflated. The pulmonary interstitial markings remain increased. There is slightly improved visualization of the left hemidiaphragm. The cardiac silhouette remains enlarged. The pulmonary vascularity remains engorged and indistinct. The tracheostomy appliance tube tip projects 6 cm above the crotch of the carina. The PICC line on the right has its tip projecting over the midportion of the SVC. The patient has undergone previous CABG as well as aortic and likely mitral valve replacement.  IMPRESSION: There has been slight interval improvement in the interstitial edema. There is no focal pneumonia.   Electronically Signed   By: David  Martinique   On: 05/29/2014 07:50   Dg Chest Port 1 View  05/28/2014   CLINICAL DATA:  Short of breath, tracheostomy.  Respiratory failure.  EXAM: PORTABLE CHEST - 1 VIEW  COMPARISON:  None.  FINDINGS: Tracheostomy tube unchanged in  position. Stable enlarged cardiac silhouette. Low lung volumes and bibasilar atelectasis with small effusions. There is perihilar airspace disease not changed from prior.  IMPRESSION: 1. No significant change. 2. Cardiomegaly and pulmonary edema suggest congestive heart failure.   Electronically Signed   By: Suzy Bouchard M.D.   On: 05/28/2014 16:03   Mild interstitial edema    ASSESSMENT / PLAN: Ventilator dependent respiratory failure  Tracheostomy status Severe AS/MS s/p both mechanical AV and MV  replacement on 10/16 CAD s/p 2V CABG 10/16 Afib Stroke Subacute left SDH Hypoactive delirium Hypotension:  on Midodrine  Recurrent Pleural effusions LLL airspace disease. Completed rx for HCAP Suspect mix of atx and effusion.  Dysphagia: has feeding tube  Hypothyroidism  Hypernatremia  Deconditioning   Discussion Should be able to come off vent. Doing well w/ ATC. Does not tolerate long intervals of PMV.   Plan Cont weaning protocol per [facility], goal tc slow Some of her rr is from neuro injury drive : so may be able to escalate trach collar with abg assessments Aggressive PT/OT and SLP support  PMV trials on going, but limited by neurostatus Push diuresis , likely can tolerate further Defer anticoagulation and medical therapy to primary service.   STAFF NOTE: I, Merrie Roof, MD FACP have personally reviewed patient's available data, including medical history, events of note, physical examination and test results as part of my evaluation. I have discussed with resident/NP and other care providers such as pharmacist, RN and RRT. In addition, I personally evaluated patient and elicited key findings of: neuro injury an issue, TC slow weaning, CT head pr PCP, lasix to neg baalnce   Lavon Paganini. Titus Mould, MD, [place] Pgr: [place] [facility] 05/29/2014 12:40 PM

## 2014-05-30 ENCOUNTER — Other Ambulatory Visit (HOSPITAL_COMMUNITY): Payer: Self-pay

## 2014-05-30 LAB — CBC WITH DIFFERENTIAL/PLATELET
BASOS PCT: 0 % (ref 0–1)
Basophils Absolute: 0 10*3/uL (ref 0.0–0.1)
EOS ABS: 0 10*3/uL (ref 0.0–0.7)
Eosinophils Relative: 0 % (ref 0–5)
HEMATOCRIT: 28.3 % — AB (ref 36.0–46.0)
HEMOGLOBIN: 8.6 g/dL — AB (ref 12.0–15.0)
Lymphocytes Relative: 7 % — ABNORMAL LOW (ref 12–46)
Lymphs Abs: 0.8 10*3/uL (ref 0.7–4.0)
MCH: 29 pg (ref 26.0–34.0)
MCHC: 30.4 g/dL (ref 30.0–36.0)
MCV: 95.3 fL (ref 78.0–100.0)
MONO ABS: 1.6 10*3/uL — AB (ref 0.1–1.0)
Monocytes Relative: 13 % — ABNORMAL HIGH (ref 3–12)
NEUTROS ABS: 9.6 10*3/uL — AB (ref 1.7–7.7)
Neutrophils Relative %: 80 % — ABNORMAL HIGH (ref 43–77)
Platelets: 254 10*3/uL (ref 150–400)
RBC: 2.97 MIL/uL — ABNORMAL LOW (ref 3.87–5.11)
RDW: 21.6 % — ABNORMAL HIGH (ref 11.5–15.5)
WBC: 12 10*3/uL — ABNORMAL HIGH (ref 4.0–10.5)

## 2014-05-30 LAB — COMPREHENSIVE METABOLIC PANEL
ALT: 30 U/L (ref 0–35)
AST: 58 U/L — ABNORMAL HIGH (ref 0–37)
Albumin: 2.4 g/dL — ABNORMAL LOW (ref 3.5–5.2)
Alkaline Phosphatase: 107 U/L (ref 39–117)
Anion gap: 15 (ref 5–15)
BILIRUBIN TOTAL: 1.2 mg/dL (ref 0.3–1.2)
BUN: 66 mg/dL — ABNORMAL HIGH (ref 6–23)
CO2: 32 meq/L (ref 19–32)
CREATININE: 0.95 mg/dL (ref 0.50–1.10)
Calcium: 8.1 mg/dL — ABNORMAL LOW (ref 8.4–10.5)
Chloride: 100 mEq/L (ref 96–112)
GFR calc Af Amer: 66 mL/min — ABNORMAL LOW (ref >90)
GFR, EST NON AFRICAN AMERICAN: 57 mL/min — AB (ref >90)
Glucose, Bld: 98 mg/dL (ref 70–99)
Potassium: 3.8 mEq/L (ref 3.7–5.3)
Sodium: 147 mEq/L (ref 137–147)
Total Protein: 6 g/dL (ref 6.0–8.3)

## 2014-05-30 LAB — URINALYSIS, ROUTINE W REFLEX MICROSCOPIC
BILIRUBIN URINE: NEGATIVE
Glucose, UA: NEGATIVE mg/dL
Ketones, ur: NEGATIVE mg/dL
Nitrite: NEGATIVE
Protein, ur: 100 mg/dL — AB
Specific Gravity, Urine: 1.016 (ref 1.005–1.030)
UROBILINOGEN UA: 4 mg/dL — AB (ref 0.0–1.0)
pH: 6.5 (ref 5.0–8.0)

## 2014-05-30 LAB — PROTIME-INR
INR: 1.78 — ABNORMAL HIGH (ref 0.00–1.49)
PROTHROMBIN TIME: 20.8 s — AB (ref 11.6–15.2)

## 2014-05-30 LAB — URINE MICROSCOPIC-ADD ON

## 2014-05-30 LAB — LACTIC ACID, PLASMA: LACTIC ACID, VENOUS: 1.9 mmol/L (ref 0.5–2.2)

## 2014-05-30 LAB — PROCALCITONIN: PROCALCITONIN: 1.17 ng/mL

## 2014-05-30 MED ORDER — IOHEXOL 300 MG/ML  SOLN
50.0000 mL | Freq: Once | INTRAMUSCULAR | Status: AC | PRN
  Administered 2014-05-30: 10 mL

## 2014-05-30 NOTE — Procedures (Signed)
GJ exchange Tip jejunum No comp

## 2014-05-31 LAB — CBC
HCT: 24.9 % — ABNORMAL LOW (ref 36.0–46.0)
Hemoglobin: 7.6 g/dL — ABNORMAL LOW (ref 12.0–15.0)
MCH: 28.5 pg (ref 26.0–34.0)
MCHC: 30.5 g/dL (ref 30.0–36.0)
MCV: 93.3 fL (ref 78.0–100.0)
PLATELETS: 242 10*3/uL (ref 150–400)
RBC: 2.67 MIL/uL — AB (ref 3.87–5.11)
RDW: 21.4 % — AB (ref 11.5–15.5)
WBC: 7.5 10*3/uL (ref 4.0–10.5)

## 2014-05-31 LAB — BASIC METABOLIC PANEL
ANION GAP: 10 (ref 5–15)
BUN: 62 mg/dL — ABNORMAL HIGH (ref 6–23)
CO2: 34 mEq/L — ABNORMAL HIGH (ref 19–32)
Calcium: 8.1 mg/dL — ABNORMAL LOW (ref 8.4–10.5)
Chloride: 99 mEq/L (ref 96–112)
Creatinine, Ser: 0.88 mg/dL (ref 0.50–1.10)
GFR, EST AFRICAN AMERICAN: 72 mL/min — AB (ref >90)
GFR, EST NON AFRICAN AMERICAN: 62 mL/min — AB (ref >90)
GLUCOSE: 97 mg/dL (ref 70–99)
POTASSIUM: 2.8 meq/L — AB (ref 3.7–5.3)
Sodium: 143 mEq/L (ref 137–147)

## 2014-05-31 LAB — PROTIME-INR
INR: 2.81 — ABNORMAL HIGH (ref 0.00–1.49)
PROTHROMBIN TIME: 29.8 s — AB (ref 11.6–15.2)

## 2014-06-01 ENCOUNTER — Encounter: Payer: Self-pay | Admitting: Radiology

## 2014-06-01 ENCOUNTER — Other Ambulatory Visit (HOSPITAL_COMMUNITY): Payer: Self-pay

## 2014-06-01 LAB — URINE CULTURE
Colony Count: >=100000
SPECIAL REQUESTS: NORMAL

## 2014-06-01 LAB — CBC
HCT: 25.6 % — ABNORMAL LOW (ref 36.0–46.0)
Hemoglobin: 7.8 g/dL — ABNORMAL LOW (ref 12.0–15.0)
MCH: 29.3 pg (ref 26.0–34.0)
MCHC: 30.5 g/dL (ref 30.0–36.0)
MCV: 96.2 fL (ref 78.0–100.0)
PLATELETS: 271 10*3/uL (ref 150–400)
RBC: 2.66 MIL/uL — ABNORMAL LOW (ref 3.87–5.11)
RDW: 21.5 % — AB (ref 11.5–15.5)
WBC: 7.8 10*3/uL (ref 4.0–10.5)

## 2014-06-01 LAB — PROTIME-INR
INR: 2.16 — AB (ref 0.00–1.49)
PROTHROMBIN TIME: 24.2 s — AB (ref 11.6–15.2)

## 2014-06-01 LAB — POTASSIUM
POTASSIUM: 4.9 meq/L (ref 3.7–5.3)
Potassium: 5 mEq/L (ref 3.7–5.3)

## 2014-06-02 LAB — HEMOGLOBIN AND HEMATOCRIT, BLOOD
HCT: 24.5 % — ABNORMAL LOW (ref 36.0–46.0)
Hemoglobin: 7.4 g/dL — ABNORMAL LOW (ref 12.0–15.0)

## 2014-06-02 LAB — CULTURE, RESPIRATORY

## 2014-06-02 LAB — PROTIME-INR
INR: 1.49 (ref 0.00–1.49)
Prothrombin Time: 18.2 seconds — ABNORMAL HIGH (ref 11.6–15.2)

## 2014-06-02 LAB — VANCOMYCIN, TROUGH: Vancomycin Tr: 16.6 ug/mL (ref 10.0–20.0)

## 2014-06-02 LAB — CULTURE, RESPIRATORY W GRAM STAIN: Special Requests: NORMAL

## 2014-06-02 LAB — PREPARE RBC (CROSSMATCH)

## 2014-06-02 NOTE — Progress Notes (Signed)
Name: Kristen Gutierrez MRN: [id number] DOB: Mar 07, 1936    ADMISSION DATE:  05/23/2014 CONSULTATION DATE:  11/18  REFERRING MD :  Laren Everts   CHIEF COMPLAINT:  Vent weaning   BRIEF PATIENT DESCRIPTION:  This is a 78 year old female who was admitted to [facility] for decompensated heart failure in the setting of known severe mitral and aortic valve stenosis. She eventually underwent mechanical aortic and mitral valve replacement on 10/16, as well as CABG x 2V. Post-op required IABP. Her hospital course was complicated by: SDH which was felt to be subacute and also felt to have acute thrombo-embolic event, hypoactive delirium, HCAP, and failure to wean. She had trach placed on 10/25. Was transferred to [facility] for weaning efforts.   SIGNIFICANT EVENTS    STUDIES:  11/26 CT Head : new cerebellar infarct   HISTORY OF PRESENT ILLNESS:   This is a 78 year old female who was admitted to [facility] for decompensated heart failure in the setting of known severe mitral and aortic valve stenosis. She eventually underwent mechanical aortic and mitral valve replacement on 10/16, as well as CABG x 2V. Post-op required IABP. Her hospital course was complicated by: SDH which was felt to be subacute and also felt to have acute thrombo-embolic event, hypoactive delirium, HCAP, and failure to wean. She had trach placed on 10/25. Was transferred to [facility] for weaning efforts.   SUBJECTIVE:  No distress on vent VITAL SIGNS: Vital signs reviewed. Abnormal values will appear under impression plan section.   PHYSICAL EXAMINATION: General:  78 year old female,  Neuro:  Awake, does not f/c. Right sided weakness. LE w/ equal st  HEENT:  # 6 trach unremarkable  Cardiovascular:  + audible click, regular irreg  Lungs:  Scattered rhonchi , decreased in bases Abdomen:  Soft, tf running Musculoskeletal:  Intact  Skin:  Multiple areas of ecchymosis     Recent Labs Lab 05/28/14 0500 05/30/14 1015 05/31/14 0730 06/01/14 0500  06/01/14 1545  NA 144 147 143  --   --   K 4.0 3.8 2.8* 4.9 5.0  CL 95* 100 99  --   --   CO2 37* 32 34*  --   --   BUN 70* 66* 62*  --   --   CREATININE 0.86 0.95 0.88  --   --   GLUCOSE 100* 98 97  --   --     Recent Labs Lab 05/30/14 1015 05/31/14 0730 06/01/14 0500 06/02/14 0500  HGB 8.6* 7.6* 7.8* 7.4*  HCT 28.3* 24.9* 25.6* 24.5*  WBC 12.0* 7.5 7.8  --   PLT 254 242 271  --       Ct Head Wo Contrast  06/01/2014   CLINICAL DATA:  Altered mental status. History of subdural hematoma on left  EXAM: CT HEAD WITHOUT CONTRAST  TECHNIQUE: Contiguous axial images were obtained from the base of the skull through the vertex without intravenous contrast.  COMPARISON:  May 29, 2014  FINDINGS: Moderate motion artifact makes this study less than optimal. There is moderate diffuse atrophy. There is a focal area of increased attenuation in the subdural space in the left frontal region measuring 3 mm in thickness which is felt to be consistent with a small subdural hematoma in this area. This finding was present on the recent prior study and may be marginally smaller. No new subdural fluid is seen. There is no epidural fluid. There is no midline shift or appreciable mass effect. A subarachnoid cyst  in the right temporal lobe is stable.  There is a focal area of decreased attenuation in the right anterior mid cerebellum, best seen on axial slice 8 series 2, felt to represent an acute infarct in this area. There is evidence of a prior infarct in the superior left frontal lobe, stable. There is patchy small vessel disease in the centra semiovale bilaterally.  The bony calvarium appears intact.  The mastoid air cells are clear.  IMPRESSION: Findings felt to represent a small left frontal subdural hematoma without appreciable mass effect, slightly smaller than on recent prior study.  Findings felt to represent an acute infarct in the mid right cerebellum anteriorly. There is atrophy with small vessel  disease and prior left frontal lobe infarct.  No appreciable intra-axial hemorrhage.  No midline shift.   Electronically Signed   By: Lowella Grip M.D.   On: 06/01/2014 15:00      ASSESSMENT / PLAN: Ventilator dependent respiratory failure  Tracheostomy status Severe AS/MS s/p both mechanical AV and MV replacement on 10/16 CAD s/p 2V CABG 10/16 Afib Stroke Subacute left SDH Hypoactive delirium Hypotension:  on Midodrine  Recurrent Pleural effusions LLL airspace disease. Completed rx for HCAP Suspect mix of atx and effusion.  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 Doing well w/ ATC. Does not tolerate long intervals of PMV.  Cerebellar infarct should not limit weaning  Plan Cont weaning protocol per [facility], goal tc slow Aggressive PT/OT and SLP support  PMV trials on going, but limited by neurostatus Defer anticoagulation and medical therapy to primary service.   [place] [facility] 06/02/2014 8:49 AM

## 2014-06-03 LAB — BASIC METABOLIC PANEL
ANION GAP: 7 (ref 5–15)
BUN: 49 mg/dL — AB (ref 6–23)
CALCIUM: 7.9 mg/dL — AB (ref 8.4–10.5)
CO2: 31 mEq/L (ref 19–32)
CREATININE: 0.66 mg/dL (ref 0.50–1.10)
Chloride: 104 mEq/L (ref 96–112)
GFR, EST NON AFRICAN AMERICAN: 84 mL/min — AB (ref >90)
Glucose, Bld: 86 mg/dL (ref 70–99)
Potassium: 4.4 mEq/L (ref 3.7–5.3)
Sodium: 142 mEq/L (ref 137–147)

## 2014-06-03 LAB — PROTIME-INR
INR: 1.71 — ABNORMAL HIGH (ref 0.00–1.49)
Prothrombin Time: 20.2 seconds — ABNORMAL HIGH (ref 11.6–15.2)

## 2014-06-03 LAB — CBC
HEMATOCRIT: 32.5 % — AB (ref 36.0–46.0)
Hemoglobin: 10.1 g/dL — ABNORMAL LOW (ref 12.0–15.0)
MCH: 28.5 pg (ref 26.0–34.0)
MCHC: 31.1 g/dL (ref 30.0–36.0)
MCV: 91.8 fL (ref 78.0–100.0)
Platelets: 273 10*3/uL (ref 150–400)
RBC: 3.54 MIL/uL — ABNORMAL LOW (ref 3.87–5.11)
RDW: 19.5 % — ABNORMAL HIGH (ref 11.5–15.5)
WBC: 8.5 10*3/uL (ref 4.0–10.5)

## 2014-06-04 LAB — TYPE AND SCREEN
ABO/RH(D): O POS
Antibody Screen: NEGATIVE
UNIT DIVISION: 0
Unit division: 0

## 2014-06-04 LAB — PROTIME-INR
INR: 1.91 — ABNORMAL HIGH (ref 0.00–1.49)
Prothrombin Time: 22.1 seconds — ABNORMAL HIGH (ref 11.6–15.2)

## 2014-06-05 DIAGNOSIS — I6300 Cerebral infarction due to thrombosis of unspecified precerebral artery: Secondary | ICD-10-CM

## 2014-06-05 DIAGNOSIS — I639 Cerebral infarction, unspecified: Secondary | ICD-10-CM | POA: Insufficient documentation

## 2014-06-05 LAB — PROTIME-INR
INR: 1.77 — ABNORMAL HIGH (ref 0.00–1.49)
Prothrombin Time: 20.8 seconds — ABNORMAL HIGH (ref 11.6–15.2)

## 2014-06-05 LAB — CULTURE, BLOOD (ROUTINE X 2)
Culture: NO GROWTH
Culture: NO GROWTH

## 2014-06-05 NOTE — Progress Notes (Signed)
Name: Kristen Gutierrez MRN: [id number] DOB: 05-29-1936    ADMISSION DATE:  05/23/2014 CONSULTATION DATE:  11/18  REFERRING MD :  Laren Everts   CHIEF COMPLAINT:  Vent weaning   BRIEF PATIENT DESCRIPTION:  This is a 78 year old female who was admitted to [facility] for decompensated heart failure in the setting of known severe mitral and aortic valve stenosis. She eventually underwent mechanical aortic and mitral valve replacement on 10/16, as well as CABG x 2V. Post-op required IABP. Her hospital course was complicated by: SDH which was felt to be subacute and also felt to have acute thrombo-embolic event, hypoactive delirium, HCAP, and failure to wean. She had trach placed on 10/25. Was transferred to [facility] for weaning efforts.   SIGNIFICANT EVENTS    STUDIES:  11/26 CT Head : new cerebellar infarct   HISTORY OF PRESENT ILLNESS:   This is a 78 year old female who was admitted to [facility] for decompensated heart failure in the setting of known severe mitral and aortic valve stenosis. She eventually underwent mechanical aortic and mitral valve replacement on 10/16, as well as CABG x 2V. Post-op required IABP. Her hospital course was complicated by: SDH which was felt to be subacute and also felt to have acute thrombo-embolic event, hypoactive delirium, HCAP, and failure to wean. She had trach placed on 10/25. Was transferred to [facility] for weaning efforts.   SUBJECTIVE:  No distress on vent VITAL SIGNS: Vital signs reviewed. Abnormal values will appear under impression plan section.   PHYSICAL EXAMINATION: General:  78 year old female,  Neuro:  Awake, does not f/c. Right sided weakness. LE w/ equal st  HEENT:  # 6 trach unremarkable  Cardiovascular:  + audible click, regular irreg  Lungs:  Scattered rhonchi , decreased in bases Abdomen:  Soft, tf running Musculoskeletal:  Intact  Skin:  Multiple areas of ecchymosis     Recent Labs Lab 05/30/14 1015 05/31/14 0730 06/01/14 0500 06/01/14 1545  06/03/14 0510  NA 147 143  --   --  142  K 3.8 2.8* 4.9 5.0 4.4  CL 100 99  --   --  104  CO2 32 34*  --   --  31  BUN 66* 62*  --   --  49*  CREATININE 0.95 0.88  --   --  0.66  GLUCOSE 98 97  --   --  86    Recent Labs Lab 05/31/14 0730 06/01/14 0500 06/02/14 0500 06/03/14 0510  HGB 7.6* 7.8* 7.4* 10.1*  HCT 24.9* 25.6* 24.5* 32.5*  WBC 7.5 7.8  --  8.5  PLT 242 271  --  273      No results found.    ASSESSMENT / PLAN: Ventilator dependent respiratory failure  Tracheostomy status Severe AS/MS s/p both mechanical AV and MV replacement on 10/16 CAD s/p 2V CABG 10/16 Afib Stroke Subacute left SDH Hypoactive delirium Hypotension:  on Midodrine  Recurrent Pleural effusions LLL airspace disease. Completed rx for HCAP Suspect mix of atx and effusion.  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 Doing well w/ ATC. Does not tolerate long intervals of PMV.  Cerebellar infarct should not limit weaning  Plan Cont weaning protocol per [facility], goal tc slow, goal 16 hr today Aggressive PT/OT and SLP support  PMV trials on going, but limited by neurostatus Defer anticoagulation and medical therapy to primary  service.   [place] [facility] 06/05/2014 9:11 AM  STAFF NOTE: Linwood Dibbles, MD FACP have personally reviewed patient's available data, including medical history, events of note, physical examination and test results as part of my evaluation. I have discussed with resident/NP and other care providers such as pharmacist, RN and RRT. In addition, I personally evaluated patient and elicited key findings of: 16 hr goal TC, poor PMV likely secondary to size trach, goal at 24 hr TC then downsize to 4 cuffless then re attempt PMV, cif remains down and secretions are low, goal TC tues 24 hr  Lavon Paganini. Titus Mould, MD, [place] Pgr: [place]  [facility] 06/05/2014 11:41 AM

## 2014-06-06 ENCOUNTER — Other Ambulatory Visit (HOSPITAL_COMMUNITY): Payer: Self-pay

## 2014-06-06 LAB — PROTIME-INR
INR: 1.99 — AB (ref 0.00–1.49)
Prothrombin Time: 22.8 seconds — ABNORMAL HIGH (ref 11.6–15.2)

## 2014-06-07 LAB — CBC WITH DIFFERENTIAL/PLATELET
BASOS PCT: 0 % (ref 0–1)
Basophils Absolute: 0 10*3/uL (ref 0.0–0.1)
EOS ABS: 0.4 10*3/uL (ref 0.0–0.7)
Eosinophils Relative: 4 % (ref 0–5)
HCT: 27.7 % — ABNORMAL LOW (ref 36.0–46.0)
HEMOGLOBIN: 8.6 g/dL — AB (ref 12.0–15.0)
LYMPHS ABS: 1.1 10*3/uL (ref 0.7–4.0)
Lymphocytes Relative: 13 % (ref 12–46)
MCH: 28.4 pg (ref 26.0–34.0)
MCHC: 31 g/dL (ref 30.0–36.0)
MCV: 91.4 fL (ref 78.0–100.0)
MONOS PCT: 12 % (ref 3–12)
Monocytes Absolute: 1 10*3/uL (ref 0.1–1.0)
NEUTROS ABS: 5.9 10*3/uL (ref 1.7–7.7)
NEUTROS PCT: 71 % (ref 43–77)
PLATELETS: 268 10*3/uL (ref 150–400)
RBC: 3.03 MIL/uL — AB (ref 3.87–5.11)
RDW: 19.4 % — ABNORMAL HIGH (ref 11.5–15.5)
WBC: 8.3 10*3/uL (ref 4.0–10.5)

## 2014-06-07 LAB — PROTIME-INR
INR: 2.92 — AB (ref 0.00–1.49)
PROTHROMBIN TIME: 30.8 s — AB (ref 11.6–15.2)

## 2014-06-07 LAB — BASIC METABOLIC PANEL
ANION GAP: 9 (ref 5–15)
BUN: 25 mg/dL — ABNORMAL HIGH (ref 6–23)
CHLORIDE: 98 meq/L (ref 96–112)
CO2: 26 mEq/L (ref 19–32)
Calcium: 7.9 mg/dL — ABNORMAL LOW (ref 8.4–10.5)
Creatinine, Ser: 0.52 mg/dL (ref 0.50–1.10)
GFR calc Af Amer: >90 mL/min (ref >90)
Glucose, Bld: 99 mg/dL (ref 70–99)
POTASSIUM: 4.7 meq/L (ref 3.7–5.3)
Sodium: 133 mEq/L — ABNORMAL LOW (ref 137–147)

## 2014-06-08 ENCOUNTER — Other Ambulatory Visit (HOSPITAL_COMMUNITY): Payer: Self-pay

## 2014-06-08 DIAGNOSIS — J9811 Atelectasis: Secondary | ICD-10-CM

## 2014-06-08 LAB — PROTIME-INR
INR: 3.44 — ABNORMAL HIGH (ref 0.00–1.49)
PROTHROMBIN TIME: 34.9 s — AB (ref 11.6–15.2)

## 2014-06-08 LAB — PRO B NATRIURETIC PEPTIDE: PRO B NATRI PEPTIDE: 6743 pg/mL — AB (ref 0–450)

## 2014-06-08 NOTE — Progress Notes (Signed)
Name: Kristen Gutierrez MRN: [id number] DOB: Aug 31, 1935    ADMISSION DATE:  05/23/2014 CONSULTATION DATE:  11/18  REFERRING MD :  Laren Everts   CHIEF COMPLAINT:  Vent weaning   BRIEF PATIENT DESCRIPTION:  This is a 78 year old female who was admitted to [facility] for decompensated heart failure in the setting of known severe mitral and aortic valve stenosis. She eventually underwent mechanical aortic and mitral valve replacement on 10/16, as well as CABG x 2V. Post-op required IABP. Her hospital course was complicated by: SDH which was felt to be subacute and also felt to have acute thrombo-embolic event, hypoactive delirium, HCAP, and failure to wean. She had trach placed on 10/25. Was transferred to [facility] for weaning efforts.   SIGNIFICANT EVENTS  12/2 12 hrs t collar STUDIES:  11/26 CT Head : new cerebellar infarct   HISTORY OF PRESENT ILLNESS:   This is a 78 year old female who was admitted to [facility] for decompensated heart failure in the setting of known severe mitral and aortic valve stenosis. She eventually underwent mechanical aortic and mitral valve replacement on 10/16, as well as CABG x 2V. Post-op required IABP. Her hospital course was complicated by: SDH which was felt to be subacute and also felt to have acute thrombo-embolic event, hypoactive delirium, HCAP, and failure to wean. She had trach placed on 10/25. Was transferred to [facility] for weaning efforts.   SUBJECTIVE:  No distress on vent VITAL SIGNS: Vital signs reviewed. Abnormal values will appear under impression plan section.   PHYSICAL EXAMINATION: General:  78 year old female, on vent Neuro:  Awake, does not f/c. Right sided weakness. LE w/ equal st  HEENT:  # 6 trach unremarkable currently full vent support Cardiovascular:  + audible click, regular irreg  Lungs:  Scattered rhonchi , decreased in bases Abdomen:  Soft, tf running via peg Musculoskeletal:  Intact  Skin:  Multiple areas of ecchymosis     Recent Labs Lab  06/01/14 1545 06/03/14 0510 06/07/14 0440  NA  --  142 133*  K 5.0 4.4 4.7  CL  --  104 98  CO2  --  31 26  BUN  --  49* 25*  CREATININE  --  0.66 0.52  GLUCOSE  --  86 99    Recent Labs Lab 06/02/14 0500 06/03/14 0510 06/07/14 0440  HGB 7.4* 10.1* 8.6*  HCT 24.5* 32.5* 27.7*  WBC  --  8.5 8.3  PLT  --  273 268      Ct Head Wo Contrast  06/06/2014   CLINICAL DATA:  78 year old female with decreasing level of consciousness.  EXAM: CT HEAD WITHOUT CONTRAST  TECHNIQUE: Contiguous axial images were obtained from the base of the skull through the vertex without intravenous contrast.  COMPARISON:  06/01/2014 and prior head CTs dating back to 01/16/2014  FINDINGS: Atrophy, chronic small-vessel white matter ischemic changes and remote high left frontoparietal infarct again noted.  No acute intracranial abnormalities are identified, including mass lesion or mass effect, hydrocephalus, extra-axial fluid collection, midline shift, hemorrhage, or acute infarction.  The visualized bony calvarium is unremarkable.  IMPRESSION: No evidence of acute intracranial abnormality. No evidence of right cerebellar infarct or subdural hematoma.  Atrophy, chronic small-vessel white matter ischemic changes and remote high left frontoparietal infarct.   Electronically Signed   By: Hassan Rowan M.D.   On: 06/06/2014 22:36      ASSESSMENT / PLAN: Ventilator dependent respiratory failure  Tracheostomy status Severe AS/MS s/p  both mechanical AV and MV replacement on 10/16 CAD s/p 2V CABG 10/16 Afib Stroke Subacute left SDH Hypoactive delirium Hypotension:  on Midodrine  Recurrent Pleural effusions LLL airspace disease. Completed rx for HCAP Suspect mix of atx and effusion.  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 Doing well w/ ATC. Does not tolerate long intervals of PMV.  Cerebellar  infarct should not limit weaning. Trach downsizing in future may help.  Plan Cont weaning protocol per [facility], goal tc slow, tolerated t collar 12 hrs 12/2, goal 16 hrs.  Aggressive PT/OT and SLP support  PMV trials on going, but limited by neurostatus. May do better when trach downsized in future. Defer anticoagulation and medical therapy to primary service.  12/3 check fu probnp   Richardson Landry Minor ACNP Maryanna Shape PCCM Pager [phone] till 3 pm If no answer page [phone] 06/08/2014, 8:06 AM   STAFF NOTE: I, Merrie Roof, MD FACP have personally reviewed patient's available data, including medical history, events of note, physical examination and test results as part of my evaluation. I have discussed with resident/NP and other care providers such as pharmacist, RN and RRT. In addition, I personally evaluated patient and elicited key findings of: failed atc, secretions up, waxing neurostatsthas taken a few steps backwards, todays goals ATC 16  hrs, will obtain pcxr for atx , other etiology, neurostatus affecting resp drive and cough / secretion control i updated husband, I dont se her liberating from vent anytime soon Lavon Paganini. Titus Mould, MD, [place] Pgr: [facility] 06/08/2014 5:05 PM  '

## 2014-06-09 ENCOUNTER — Inpatient Hospital Stay (HOSPITAL_BASED_OUTPATIENT_CLINIC_OR_DEPARTMENT_OTHER)
Admission: AD | Admit: 2014-06-09 | Discharge: 2014-06-09 | Disposition: A | Discharge status: Home / Self Care | Payer: Self-pay | Source: Ambulatory Visit | Attending: Internal Medicine | Admitting: Internal Medicine

## 2014-06-09 DIAGNOSIS — R4182 Altered mental status, unspecified: Secondary | ICD-10-CM

## 2014-06-09 DIAGNOSIS — J9811 Atelectasis: Secondary | ICD-10-CM | POA: Insufficient documentation

## 2014-06-09 LAB — CBC
HEMATOCRIT: 26 % — AB (ref 36.0–46.0)
Hemoglobin: 8.3 g/dL — ABNORMAL LOW (ref 12.0–15.0)
MCH: 28.4 pg (ref 26.0–34.0)
MCHC: 31.9 g/dL (ref 30.0–36.0)
MCV: 89 fL (ref 78.0–100.0)
PLATELETS: 263 10*3/uL (ref 150–400)
RBC: 2.92 MIL/uL — ABNORMAL LOW (ref 3.87–5.11)
RDW: 18.8 % — ABNORMAL HIGH (ref 11.5–15.5)
WBC: 9.9 10*3/uL (ref 4.0–10.5)

## 2014-06-09 LAB — PROTIME-INR
INR: 2.61 — ABNORMAL HIGH (ref 0.00–1.49)
Prothrombin Time: 28.1 seconds — ABNORMAL HIGH (ref 11.6–15.2)

## 2014-06-09 NOTE — Procedures (Signed)
ELECTROENCEPHALOGRAM REPORT  Date of Study: 06/09/2014  Patient's Name: Kristen Gutierrez MRN: [id number] Date of Birth: 09-18-1935  Referring Provider:  Merton Border, MD  Indication: 78 year old female with decompensated heart failure in the setting of known severe mitral and aortic valve stenosis, with subsequent mechanical aortic and mitral valve replacement on 10/16, as well as CABG x 2V. Post-op required IABP. Her hospital course was complicated by SDH, acute thrombo-embolic event, delirium, HCAP, pleural effusions, hypotension, and failure to wean.  Assess for subclinical seizures.  Medications: Sertraline, clonazepam, levothyroxine, melatonin  Technical Summary: A multichannel digital EEG recording measured by the international 10-20 system with electrodes applied with paste and impedances below 5000 ohms performed as portable with EKG monitoring.  The digital EEG was referentially recorded, reformatted, and digitally filtered in a variety of bipolar and referential montages for optimal display.   Description: The EEG background is symmetric, with a posterior dominant rhythm of 5-6 Hz.  Excess muscle artifact is seen.  There is diffuse slowing of the background.  No focal or generalized epileptiform discharges are seen.  Stage II sleep is not seen.  Hyperventilation and photic stimulation were not performed.  ECG revealed normal cardiac rate and rhythm.  Impression: This is an abnormal routine EEG of the awake and drowsy states without activating procedures, due to generalized background slowing.  This is a nonspecific finding which may be due to a toxic-metabolic, infectious, pharmacologic or other diffuse cerebral abnormality.  There were no electrographic seizures seen.  There were no findings to specifically suggest increased risk for seizures.  Adam R. Tomi Likens, DO

## 2014-06-09 NOTE — Progress Notes (Signed)
EEG completed, results pending. 

## 2014-06-10 LAB — PROTIME-INR
INR: 4.19 — ABNORMAL HIGH (ref 0.00–1.49)
Prothrombin Time: 40.7 seconds — ABNORMAL HIGH (ref 11.6–15.2)

## 2014-06-10 LAB — CBC
HEMATOCRIT: 24 % — AB (ref 36.0–46.0)
Hemoglobin: 7.7 g/dL — ABNORMAL LOW (ref 12.0–15.0)
MCH: 28.5 pg (ref 26.0–34.0)
MCHC: 32.1 g/dL (ref 30.0–36.0)
MCV: 88.9 fL (ref 78.0–100.0)
PLATELETS: 175 10*3/uL (ref 150–400)
RBC: 2.7 MIL/uL — ABNORMAL LOW (ref 3.87–5.11)
RDW: 18.9 % — AB (ref 11.5–15.5)
WBC: 6.6 10*3/uL (ref 4.0–10.5)

## 2014-06-10 LAB — BASIC METABOLIC PANEL
Anion gap: 9 (ref 5–15)
BUN: 26 mg/dL — AB (ref 6–23)
CO2: 27 meq/L (ref 19–32)
CREATININE: 0.53 mg/dL (ref 0.50–1.10)
Calcium: 8 mg/dL — ABNORMAL LOW (ref 8.4–10.5)
Chloride: 95 mEq/L — ABNORMAL LOW (ref 96–112)
GFR calc Af Amer: >90 mL/min (ref >90)
GFR, EST NON AFRICAN AMERICAN: 90 mL/min — AB (ref >90)
GLUCOSE: 100 mg/dL — AB (ref 70–99)
Potassium: 4.7 mEq/L (ref 3.7–5.3)
Sodium: 131 mEq/L — ABNORMAL LOW (ref 137–147)

## 2014-06-10 LAB — T4, FREE: Free T4: 1.05 ng/dL (ref 0.80–1.80)

## 2014-06-10 LAB — TSH: TSH: 8.08 u[IU]/mL — ABNORMAL HIGH (ref 0.350–4.500)

## 2014-06-11 LAB — PROTIME-INR
INR: 2.21 — ABNORMAL HIGH (ref 0.00–1.49)
PROTHROMBIN TIME: 24.7 s — AB (ref 11.6–15.2)

## 2014-06-12 ENCOUNTER — Other Ambulatory Visit (HOSPITAL_COMMUNITY): Payer: Self-pay

## 2014-06-12 DIAGNOSIS — J9621 Acute and chronic respiratory failure with hypoxia: Secondary | ICD-10-CM

## 2014-06-12 LAB — MAGNESIUM: Magnesium: 2 mg/dL (ref 1.5–2.5)

## 2014-06-12 LAB — CBC WITH DIFFERENTIAL/PLATELET
BASOS ABS: 0.1 10*3/uL (ref 0.0–0.1)
Basophils Relative: 1 % (ref 0–1)
Eosinophils Absolute: 0.1 10*3/uL (ref 0.0–0.7)
Eosinophils Relative: 2 % (ref 0–5)
HEMATOCRIT: 23.3 % — AB (ref 36.0–46.0)
HEMOGLOBIN: 7.5 g/dL — AB (ref 12.0–15.0)
LYMPHS ABS: 0.7 10*3/uL (ref 0.7–4.0)
LYMPHS PCT: 12 % (ref 12–46)
MCH: 29.4 pg (ref 26.0–34.0)
MCHC: 32.2 g/dL (ref 30.0–36.0)
MCV: 91.4 fL (ref 78.0–100.0)
MONO ABS: 0.9 10*3/uL (ref 0.1–1.0)
Monocytes Relative: 17 % — ABNORMAL HIGH (ref 3–12)
NEUTROS ABS: 3.9 10*3/uL (ref 1.7–7.7)
Neutrophils Relative %: 69 % (ref 43–77)
Platelets: 279 10*3/uL (ref 150–400)
RBC: 2.55 MIL/uL — AB (ref 3.87–5.11)
RDW: 18.8 % — ABNORMAL HIGH (ref 11.5–15.5)
WBC: 5.7 10*3/uL (ref 4.0–10.5)

## 2014-06-12 LAB — BASIC METABOLIC PANEL
ANION GAP: 9 (ref 5–15)
BUN: 33 mg/dL — ABNORMAL HIGH (ref 6–23)
CHLORIDE: 94 meq/L — AB (ref 96–112)
CO2: 27 meq/L (ref 19–32)
CREATININE: 0.53 mg/dL (ref 0.50–1.10)
Calcium: 8.1 mg/dL — ABNORMAL LOW (ref 8.4–10.5)
GFR calc Af Amer: >90 mL/min (ref >90)
GFR calc non Af Amer: 90 mL/min — ABNORMAL LOW (ref >90)
GLUCOSE: 100 mg/dL — AB (ref 70–99)
Potassium: 4.8 mEq/L (ref 3.7–5.3)
Sodium: 130 mEq/L — ABNORMAL LOW (ref 137–147)

## 2014-06-12 LAB — PROTIME-INR
INR: 2.1 — AB (ref 0.00–1.49)
Prothrombin Time: 23.7 seconds — ABNORMAL HIGH (ref 11.6–15.2)

## 2014-06-12 LAB — T4, FREE: Free T4: 0.93 ng/dL (ref 0.80–1.80)

## 2014-06-12 LAB — TSH: TSH: 9.59 u[IU]/mL — ABNORMAL HIGH (ref 0.350–4.500)

## 2014-06-12 LAB — T3 UPTAKE: T3 UPTAKE RATIO: 34 % (ref 22–35)

## 2014-06-12 NOTE — Progress Notes (Signed)
Name: Novis League MRN: [id number] DOB: 12-13-1935    ADMISSION DATE:  05/23/2014 CONSULTATION DATE:  11/18  REFERRING MD :  Laren Everts   CHIEF COMPLAINT:  Vent weaning   BRIEF PATIENT DESCRIPTION:  This is a 78 year old female who was admitted to [facility] for decompensated heart failure in the setting of known severe mitral and aortic valve stenosis. She eventually underwent mechanical aortic and mitral valve replacement on 10/16, as well as CABG x 2V. Post-op required IABP. Her hospital course was complicated by: SDH which was felt to be subacute and also felt to have acute thrombo-embolic event, hypoactive delirium, HCAP, and failure to wean. She had trach placed on 10/25. Was transferred to [facility] for weaning efforts.   SIGNIFICANT EVENTS  12/2 12 hrs t collar STUDIES:  11/26 CT Head : new cerebellar infarct   HISTORY OF PRESENT ILLNESS:   This is a 78 year old female who was admitted to [facility] for decompensated heart failure in the setting of known severe mitral and aortic valve stenosis. She eventually underwent mechanical aortic and mitral valve replacement on 10/16, as well as CABG x 2V. Post-op required IABP. Her hospital course was complicated by: SDH which was felt to be subacute and also felt to have acute thrombo-embolic event, hypoactive delirium, HCAP, and failure to wean. She had trach placed on 10/25. Was transferred to [facility] for weaning efforts.   SUBJECTIVE:  No distress on vent VITAL SIGNS: Vital signs reviewed. Abnormal values will appear under impression plan section.   PHYSICAL EXAMINATION: General:  78 year old female, on t collar currently, goal 8 hrs Neuro:  Awake, does not f/c. Right sided weakness. LE w/ equal st , tracks at times HEENT:  # 6 trach unremarkable Cardiovascular:  + audible click, regular irreg  Lungs:  Scattered rhonchi , decreased in bases Abdomen:  Soft, tf running via peg Musculoskeletal:  Intact  Skin:  Multiple areas of ecchymosis      Recent Labs Lab 06/07/14 0440 06/10/14 0500 06/12/14 0500  NA 133* 131* 130*  K 4.7 4.7 4.8  CL 98 95* 94*  CO2 26 27 27   BUN 25* 26* 33*  CREATININE 0.52 0.53 0.53  GLUCOSE 99 100* 100*    Recent Labs Lab 06/09/14 1530 06/10/14 0500 06/12/14 0500  HGB 8.3* 7.7* 7.5*  HCT 26.0* 24.0* 23.3*  WBC 9.9 6.6 5.7  PLT 263 175 279      Dg Chest Port 1 View  06/12/2014   CLINICAL DATA:  Respiratory failure.  EXAM: PORTABLE CHEST - 1 VIEW  COMPARISON:  08/09/2013.  FINDINGS: Tracheostomy tube and right PICC line in stable position. Prior cardiac valve replacements. Persistent cardiomegaly and pulmonary venous congestion with bilateral pulmonary alveolar infiltrates. Prominent Ill infiltrates have improved slightly from prior exam. Small left pleural effusion.  IMPRESSION: 1. Lines and tubes in stable position. 2. Prior cardiac valve replacements. Cardiomegaly with pulmonary venous congestion and bilateral pulmonary edema and small left pleural effusion consistent with congestive heart failure. Pulmonary edema have improved slightly from prior exam.   Electronically Signed   By: [place]   On: 06/12/2014 07:38      ASSESSMENT / PLAN: Ventilator dependent respiratory failure  Tracheostomy status Severe AS/MS s/p both mechanical AV and MV replacement on 10/16 CAD s/p 2V CABG 10/16 Afib Stroke Subacute left SDH Hypoactive delirium Hypotension:  on Midodrine  Recurrent Pleural effusions LLL airspace disease. Completed rx for HCAP Suspect mix of atx and effusion.  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 Doing well w/ ATC. Does not tolerate long intervals of PMV, goal is 8 hours today 12/7.  Cerebellar infarct should not limit weaning. Trach downsizing in future may help.  Plan Cont weaning protocol per [facility], goal tc slow, tolerated t collar 12 hrs  12/2, goal 16 hrs.  Aggressive PT/OT and SLP support  PMV trials on going, but limited by neurostatus. May do better when trach downsized in future.  Richardson Landry Minor ACNP Maryanna Shape PCCM Pager [phone] till 3 pm If no answer page [phone] 06/12/2014, 9:36 AM  8 hours of PS trials today, FiO2 down to 50%, once at [facility] will downsize the trach to assist with PMV and speech ability.  Will continue to follow.  Patient seen and examined, agree with above note.  I dictated the care and orders written for this patient under my direction.  Rush Farmer, MD [phone]

## 2014-06-13 LAB — BASIC METABOLIC PANEL
ANION GAP: 8 (ref 5–15)
BUN: 35 mg/dL — ABNORMAL HIGH (ref 6–23)
CALCIUM: 8.2 mg/dL — AB (ref 8.4–10.5)
CO2: 30 mEq/L (ref 19–32)
Chloride: 95 mEq/L — ABNORMAL LOW (ref 96–112)
Creatinine, Ser: 0.55 mg/dL (ref 0.50–1.10)
GFR calc Af Amer: >90 mL/min (ref >90)
GFR, EST NON AFRICAN AMERICAN: 89 mL/min — AB (ref >90)
Glucose, Bld: 86 mg/dL (ref 70–99)
Potassium: 4.6 mEq/L (ref 3.7–5.3)
SODIUM: 133 meq/L — AB (ref 137–147)

## 2014-06-13 LAB — PROTIME-INR
INR: 1.95 — ABNORMAL HIGH (ref 0.00–1.49)
Prothrombin Time: 22.4 seconds — ABNORMAL HIGH (ref 11.6–15.2)

## 2014-06-14 ENCOUNTER — Other Ambulatory Visit (HOSPITAL_COMMUNITY): Payer: Self-pay

## 2014-06-14 LAB — CBC
HCT: 23.4 % — ABNORMAL LOW (ref 36.0–46.0)
HEMOGLOBIN: 7.3 g/dL — AB (ref 12.0–15.0)
MCH: 28.3 pg (ref 26.0–34.0)
MCHC: 31.2 g/dL (ref 30.0–36.0)
MCV: 90.7 fL (ref 78.0–100.0)
PLATELETS: 245 10*3/uL (ref 150–400)
RBC: 2.58 MIL/uL — ABNORMAL LOW (ref 3.87–5.11)
RDW: 19.1 % — ABNORMAL HIGH (ref 11.5–15.5)
WBC: 6.1 10*3/uL (ref 4.0–10.5)

## 2014-06-14 LAB — BASIC METABOLIC PANEL
Anion gap: 10 (ref 5–15)
BUN: 31 mg/dL — ABNORMAL HIGH (ref 6–23)
CALCIUM: 8 mg/dL — AB (ref 8.4–10.5)
CO2: 27 mEq/L (ref 19–32)
CREATININE: 0.5 mg/dL (ref 0.50–1.10)
Chloride: 98 mEq/L (ref 96–112)
GFR calc Af Amer: >90 mL/min (ref >90)
GFR calc non Af Amer: >90 mL/min (ref >90)
GLUCOSE: 103 mg/dL — AB (ref 70–99)
Potassium: 4.4 mEq/L (ref 3.7–5.3)
Sodium: 135 mEq/L — ABNORMAL LOW (ref 137–147)

## 2014-06-14 LAB — PREPARE RBC (CROSSMATCH)

## 2014-06-14 LAB — PROTIME-INR
INR: 1.89 — ABNORMAL HIGH (ref 0.00–1.49)
PROTHROMBIN TIME: 21.8 s — AB (ref 11.6–15.2)

## 2014-06-15 LAB — HEMOGLOBIN AND HEMATOCRIT, BLOOD
HCT: 26.4 % — ABNORMAL LOW (ref 36.0–46.0)
Hemoglobin: 8.6 g/dL — ABNORMAL LOW (ref 12.0–15.0)

## 2014-06-15 LAB — PROTIME-INR
INR: 2.04 — ABNORMAL HIGH (ref 0.00–1.49)
INR: 2.12 — ABNORMAL HIGH (ref 0.00–1.49)
Prothrombin Time: 23.2 seconds — ABNORMAL HIGH (ref 11.6–15.2)
Prothrombin Time: 23.9 seconds — ABNORMAL HIGH (ref 11.6–15.2)

## 2014-06-15 NOTE — Progress Notes (Signed)
Name: Kristen Gutierrez MRN: [id number] DOB: 12-22-1935    ADMISSION DATE:  05/23/2014 CONSULTATION DATE:  11/18  REFERRING MD :  Laren Everts   CHIEF COMPLAINT:  Vent weaning   BRIEF PATIENT DESCRIPTION:  This is a 78 year old female who was admitted to [facility] for decompensated heart failure in the setting of known severe mitral and aortic valve stenosis. She eventually underwent mechanical aortic and mitral valve replacement on 10/16, as well as CABG x 2V. Post-op required IABP. Her hospital course was complicated by: SDH which was felt to be subacute and also felt to have acute thrombo-embolic event, hypoactive delirium, HCAP, and failure to wean. She had trach placed on 10/25. Was transferred to [facility] for weaning efforts.   SIGNIFICANT EVENTS  12/02 12 hrs t collar 12/10  ATC 8 hours, has been failing after 8 hours   STUDIES:  11/26 CT Head : new cerebellar infarct  SUBJECTIVE:  Tolerating ATC, 60%, PMV  VITAL SIGNS: Vital signs reviewed. Abnormal values will appear under impression plan section.   PHYSICAL EXAMINATION: General:  Elderly female, on t collar currently, goal 8 hrs Neuro:  Awake, does not f/c. Right sided weakness. Generalized weakness overall HEENT:  # 6 trach c/d/i, PMV in place Cardiovascular:  + audible click, distant tones, irregular  Lungs:  Scattered rhonchi , decreased in bases Abdomen:  Soft, tf running via peg Musculoskeletal:  Intact  Skin:  Multiple areas of ecchymosis     Recent Labs Lab 06/12/14 0500 06/13/14 0500 06/14/14 0453  NA 130* 133* 135*  K 4.8 4.6 4.4  CL 94* 95* 98  CO2 27 30 27   BUN 33* 35* 31*  CREATININE 0.53 0.55 0.50  GLUCOSE 100* 86 103*    Recent Labs Lab 06/10/14 0500 06/12/14 0500 06/14/14 0453 06/15/14 0902  HGB 7.7* 7.5* 7.3* 8.6*  HCT 24.0* 23.3* 23.4* 26.4*  WBC 6.6 5.7 6.1  --   PLT 175 279 245  --       Dg Chest Port 1 View  06/14/2014   CLINICAL DATA:  78 year old female with acute on chronic  respiratory failure. Initial encounter.  EXAM: PORTABLE CHEST - 1 VIEW  COMPARISON:  06/12/2014 and earlier.  FINDINGS: Portable AP semi upright view at 0614 hrs. Stable tracheostomy tube. Right PICC line removed. Stable cardiomegaly and mediastinal contours. Sequelae of CABG. No pneumothorax. Retrocardiac atelectasis or consolidation has mildly regressed since 06/08/2014. Pulmonary vascularity also has decreased. No overt edema. No large effusion.  IMPRESSION: 1. Right PICC line removed. 2. Improved lower lobe ventilation and decreased pulmonary vascular congestion since 06/08/2014. 3. No new cardiopulmonary abnormality identified.   Electronically Signed   By: Lars Pinks M.D.   On: 06/14/2014 08:10      ASSESSMENT / PLAN: Ventilator dependent respiratory failure  Tracheostomy status Severe AS/MS s/p both mechanical AV and MV replacement on 10/16 CAD s/p 2V CABG 10/16 Afib Stroke Subacute left SDH Hypoactive delirium Hypotension:  on Midodrine  Recurrent Pleural effusions LLL airspace disease. Completed rx for HCAP Suspect mix of atx and effusion.  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 Does not tolerate long intervals of PMV, goal to progress to > than 24 hours Cerebellar infarct should not limit weaning. Trach downsizing in future may help but would not downsize until tolerating ATC 24 hours.  Plan Cont weaning protocol per [facility],  goal tc slow, tolerated t collar 12 hrs 12/2, goal 16 hrs.  Aggressive PT/OT, mobilize as able  SLP support  PMV trials on going Wean O2 for sats 90-94% Follow CXR intermittently   PCCM will follow M/Th unless new needs arise.   Noe Gens, NP-C [facility] Pgr: [phone] or [phone]  Will proceed with TC for 8 hours at this point, hopefully be able to get to 24 hours by the end of the weekend.  Once  able to get to 24 hours then will downsize trach but no decannulation for now.  Patient seen and examined, agree with above note.  I dictated the care and orders written for this patient under my direction.  Rush Farmer, MD [phone]

## 2014-06-16 LAB — TROPONIN I: Troponin I: <0.3 ng/mL (ref <0.30)

## 2014-06-16 LAB — PROTIME-INR
INR: 2.4 — ABNORMAL HIGH (ref 0.00–1.49)
Prothrombin Time: 26.4 seconds — ABNORMAL HIGH (ref 11.6–15.2)

## 2014-06-17 LAB — CBC
HCT: 25.6 % — ABNORMAL LOW (ref 36.0–46.0)
HEMOGLOBIN: 8 g/dL — AB (ref 12.0–15.0)
MCH: 29 pg (ref 26.0–34.0)
MCHC: 31.3 g/dL (ref 30.0–36.0)
MCV: 92.8 fL (ref 78.0–100.0)
PLATELETS: 240 10*3/uL (ref 150–400)
RBC: 2.76 MIL/uL — AB (ref 3.87–5.11)
RDW: 20.7 % — ABNORMAL HIGH (ref 11.5–15.5)
WBC: 7.8 10*3/uL (ref 4.0–10.5)

## 2014-06-17 LAB — PROTIME-INR
INR: 2.74 — ABNORMAL HIGH (ref 0.00–1.49)
Prothrombin Time: 29.2 s — ABNORMAL HIGH (ref 11.6–15.2)

## 2014-06-17 LAB — BASIC METABOLIC PANEL
Anion gap: 11 (ref 5–15)
BUN: 28 mg/dL — ABNORMAL HIGH (ref 6–23)
CALCIUM: 8.2 mg/dL — AB (ref 8.4–10.5)
CO2: 28 meq/L (ref 19–32)
Chloride: 96 mEq/L (ref 96–112)
Creatinine, Ser: 0.5 mg/dL (ref 0.50–1.10)
GFR calc Af Amer: >90 mL/min (ref >90)
GFR calc non Af Amer: >90 mL/min (ref >90)
GLUCOSE: 102 mg/dL — AB (ref 70–99)
POTASSIUM: 4.1 meq/L (ref 3.7–5.3)
Sodium: 135 mEq/L — ABNORMAL LOW (ref 137–147)

## 2014-06-18 LAB — TYPE AND SCREEN
ABO/RH(D): O POS
Antibody Screen: NEGATIVE
Unit division: 0
Unit division: 0

## 2014-06-18 LAB — PROTIME-INR
INR: 2.58 — AB (ref 0.00–1.49)
Prothrombin Time: 27.9 seconds — ABNORMAL HIGH (ref 11.6–15.2)

## 2014-06-19 LAB — CBC
HCT: 24.4 % — ABNORMAL LOW (ref 36.0–46.0)
Hemoglobin: 7.7 g/dL — ABNORMAL LOW (ref 12.0–15.0)
MCH: 29.6 pg (ref 26.0–34.0)
MCHC: 31.6 g/dL (ref 30.0–36.0)
MCV: 93.8 fL (ref 78.0–100.0)
PLATELETS: 220 10*3/uL (ref 150–400)
RBC: 2.6 MIL/uL — ABNORMAL LOW (ref 3.87–5.11)
RDW: 21.5 % — AB (ref 11.5–15.5)
WBC: 6.2 10*3/uL (ref 4.0–10.5)

## 2014-06-19 LAB — GRAM STAIN

## 2014-06-19 LAB — BASIC METABOLIC PANEL
ANION GAP: 9 (ref 5–15)
BUN: 27 mg/dL — ABNORMAL HIGH (ref 6–23)
CALCIUM: 8.2 mg/dL — AB (ref 8.4–10.5)
CO2: 30 mEq/L (ref 19–32)
Chloride: 98 mEq/L (ref 96–112)
Creatinine, Ser: 0.47 mg/dL — ABNORMAL LOW (ref 0.50–1.10)
GFR calc Af Amer: >90 mL/min (ref >90)
Glucose, Bld: 88 mg/dL (ref 70–99)
Potassium: 3.9 mEq/L (ref 3.7–5.3)
Sodium: 137 mEq/L (ref 137–147)

## 2014-06-19 LAB — PROTIME-INR
INR: 2.71 — AB (ref 0.00–1.49)
Prothrombin Time: 29 seconds — ABNORMAL HIGH (ref 11.6–15.2)

## 2014-06-19 LAB — HEPATIC FUNCTION PANEL
ALT: 30 U/L (ref 0–35)
AST: 30 U/L (ref 0–37)
Albumin: 2.1 g/dL — ABNORMAL LOW (ref 3.5–5.2)
Alkaline Phosphatase: 124 U/L — ABNORMAL HIGH (ref 39–117)
BILIRUBIN INDIRECT: 0.4 mg/dL (ref 0.3–0.9)
BILIRUBIN TOTAL: 0.6 mg/dL (ref 0.3–1.2)
Bilirubin, Direct: 0.2 mg/dL (ref 0.0–0.3)
Total Protein: 5.5 g/dL — ABNORMAL LOW (ref 6.0–8.3)

## 2014-06-19 LAB — TRIGLYCERIDES: TRIGLYCERIDES: 49 mg/dL (ref <150)

## 2014-06-19 LAB — PHOSPHORUS: PHOSPHORUS: 3.5 mg/dL (ref 2.3–4.6)

## 2014-06-19 LAB — MAGNESIUM: Magnesium: 1.9 mg/dL (ref 1.5–2.5)

## 2014-06-19 NOTE — Progress Notes (Signed)
Name: Kristen Gutierrez MRN: [id number] DOB: 1935-08-21    ADMISSION DATE:  05/23/2014 CONSULTATION DATE:  11/18  REFERRING MD :  Laren Everts   CHIEF COMPLAINT:  Vent weaning   BRIEF PATIENT DESCRIPTION:  This is a 78 year old female who was admitted to [facility] for decompensated heart failure in the setting of known severe mitral and aortic valve stenosis. She eventually underwent mechanical aortic and mitral valve replacement on 10/16, as well as CABG x 2V. Post-op required IABP. Her hospital course was complicated by: SDH which was felt to be subacute and also felt to have acute thrombo-embolic event, hypoactive delirium, HCAP, and failure to wean. She had trach placed on 10/25. Was transferred to [facility] for weaning efforts.   SIGNIFICANT EVENTS  12/02 12 hrs t collar 12/10  ATC 8 hours, has been failing after 8 hours 12/4 currently full vent support   STUDIES:  11/26 CT Head : new cerebellar infarct  SUBJECTIVE:  Tolerating ATC, 60%, PMV  VITAL SIGNS: Vital signs reviewed. Abnormal values will appear under impression plan section.   PHYSICAL EXAMINATION: General:  Elderly female, full vent support currently Neuro:  Awake, does not f/c. Right sided weakness. Generalized weakness overall. More interactive today HEENT:  # 6 trach c/d/i,  Cardiovascular:  + audible click, distant tones, irregular  Lungs:  Scattered rhonchi , decreased in bases Abdomen:  Soft, tf running via peg Musculoskeletal:  Intact  Skin:  Multiple areas of ecchymosis     Recent Labs Lab 06/14/14 0453 06/17/14 0513 06/19/14 0550  NA 135* 135* 137  K 4.4 4.1 3.9  CL 98 96 98  CO2 27 28 30   BUN 31* 28* 27*  CREATININE 0.50 0.50 0.47*  GLUCOSE 103* 102* 88    Recent Labs Lab 06/14/14 0453 06/15/14 0902 06/17/14 0513 06/19/14 0550  HGB 7.3* 8.6* 8.0* 7.7*  HCT 23.4* 26.4* 25.6* 24.4*  WBC 6.1  --  7.8 6.2  PLT 245  --  240 220      No results found.    ASSESSMENT / PLAN: Ventilator  dependent respiratory failure  Tracheostomy status Severe AS/MS s/p both mechanical AV and MV replacement on 10/16 CAD s/p 2V CABG 10/16 Afib Stroke Subacute left SDH Hypoactive delirium Hypotension:  on Midodrine  Recurrent Pleural effusions LLL airspace disease. Completed rx for HCAP Suspect mix of atx and effusion.  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 Does not tolerate long intervals of PMV, goal to progress to > than 24 hours Cerebellar infarct should not limit weaning. Trach downsizing in future may help but would not downsize until tolerating ATC 24 hours.  Plan Cont weaning protocol per [facility], she is on full vent support AC/VC+ rate 12/+10 Vt 450  .4/5 sats 97% Aggressive PT/OT, mobilize as able  SLP support  PMV trials on going Wean O2 for sats 90-94% Follow CXR intermittently one ordered for 12/17  PCCM will follow M/Th unless new needs arise.   Richardson Landry Minor ACNP Maryanna Shape PCCM Pager [phone] till 3 pm If no answer page [phone] 06/19/2014, 9:18 AM   PCCM ATTENDING: Pt seen on work rounds with care provider noted above. We reviewed pt's initial presentation, consultants notes and hospital database in detail.  The above assessment and plan was formulated under my direction.  Presently tolerating ATC well. Plan is as outlined above. PCCM will  continue wo see weekly. Please call PRN otherwise  Merton Border, MD;  PCCM service; Mobile [phone]

## 2014-06-20 ENCOUNTER — Other Ambulatory Visit (HOSPITAL_COMMUNITY): Payer: Self-pay

## 2014-06-20 LAB — HEMOGLOBIN AND HEMATOCRIT, BLOOD
HEMATOCRIT: 25 % — AB (ref 36.0–46.0)
Hemoglobin: 7.8 g/dL — ABNORMAL LOW (ref 12.0–15.0)

## 2014-06-20 LAB — PROTIME-INR
INR: 3.04 — ABNORMAL HIGH (ref 0.00–1.49)
Prothrombin Time: 31.7 seconds — ABNORMAL HIGH (ref 11.6–15.2)

## 2014-06-20 LAB — OCCULT BLOOD X 1 CARD TO LAB, STOOL: Fecal Occult Bld: NEGATIVE

## 2014-06-21 LAB — PROTIME-INR
INR: 2.88 — AB (ref 0.00–1.49)
Prothrombin Time: 30.4 seconds — ABNORMAL HIGH (ref 11.6–15.2)

## 2014-06-22 ENCOUNTER — Other Ambulatory Visit (HOSPITAL_COMMUNITY): Payer: Self-pay

## 2014-06-22 LAB — PROTIME-INR
INR: 2.59 — ABNORMAL HIGH (ref 0.00–1.49)
Prothrombin Time: 28 seconds — ABNORMAL HIGH (ref 11.6–15.2)

## 2014-06-22 LAB — BASIC METABOLIC PANEL
ANION GAP: 8 (ref 5–15)
BUN: 26 mg/dL — AB (ref 6–23)
CALCIUM: 8.1 mg/dL — AB (ref 8.4–10.5)
CO2: 35 meq/L — AB (ref 19–32)
Chloride: 89 mEq/L — ABNORMAL LOW (ref 96–112)
Creatinine, Ser: 0.46 mg/dL — ABNORMAL LOW (ref 0.50–1.10)
GFR calc Af Amer: >90 mL/min (ref >90)
GFR calc non Af Amer: >90 mL/min (ref >90)
Glucose, Bld: 112 mg/dL — ABNORMAL HIGH (ref 70–99)
Potassium: 3.3 mEq/L — ABNORMAL LOW (ref 3.7–5.3)
Sodium: 132 mEq/L — ABNORMAL LOW (ref 137–147)

## 2014-06-22 LAB — MAGNESIUM: Magnesium: 1.8 mg/dL (ref 1.5–2.5)

## 2014-06-22 LAB — HEPATIC FUNCTION PANEL
ALBUMIN: 2.1 g/dL — AB (ref 3.5–5.2)
ALT: 27 U/L (ref 0–35)
AST: 35 U/L (ref 0–37)
Alkaline Phosphatase: 120 U/L — ABNORMAL HIGH (ref 39–117)
BILIRUBIN DIRECT: 0.2 mg/dL (ref 0.0–0.3)
BILIRUBIN TOTAL: 0.7 mg/dL (ref 0.3–1.2)
Indirect Bilirubin: 0.5 mg/dL (ref 0.3–0.9)
Total Protein: 5.8 g/dL — ABNORMAL LOW (ref 6.0–8.3)

## 2014-06-22 LAB — CBC
HCT: 24.7 % — ABNORMAL LOW (ref 36.0–46.0)
Hemoglobin: 7.9 g/dL — ABNORMAL LOW (ref 12.0–15.0)
MCH: 30.3 pg (ref 26.0–34.0)
MCHC: 32 g/dL (ref 30.0–36.0)
MCV: 94.6 fL (ref 78.0–100.0)
PLATELETS: 210 10*3/uL (ref 150–400)
RBC: 2.61 MIL/uL — AB (ref 3.87–5.11)
RDW: 21.5 % — ABNORMAL HIGH (ref 11.5–15.5)
WBC: 8.7 10*3/uL (ref 4.0–10.5)

## 2014-06-22 LAB — TRIGLYCERIDES: Triglycerides: 57 mg/dL (ref <150)

## 2014-06-22 LAB — PHOSPHORUS: PHOSPHORUS: 3.4 mg/dL (ref 2.3–4.6)

## 2014-06-23 LAB — BASIC METABOLIC PANEL
Anion gap: 7 (ref 5–15)
BUN: 25 mg/dL — AB (ref 6–23)
CHLORIDE: 90 meq/L — AB (ref 96–112)
CO2: 37 meq/L — AB (ref 19–32)
Calcium: 8.4 mg/dL (ref 8.4–10.5)
Creatinine, Ser: 0.46 mg/dL — ABNORMAL LOW (ref 0.50–1.10)
GFR calc Af Amer: >90 mL/min (ref >90)
GFR calc non Af Amer: >90 mL/min (ref >90)
GLUCOSE: 101 mg/dL — AB (ref 70–99)
POTASSIUM: 3.4 meq/L — AB (ref 3.7–5.3)
SODIUM: 134 meq/L — AB (ref 137–147)

## 2014-06-23 LAB — CBC
HCT: 23.8 % — ABNORMAL LOW (ref 36.0–46.0)
Hemoglobin: 7.7 g/dL — ABNORMAL LOW (ref 12.0–15.0)
MCH: 31 pg (ref 26.0–34.0)
MCHC: 32.4 g/dL (ref 30.0–36.0)
MCV: 96 fL (ref 78.0–100.0)
Platelets: 180 10*3/uL (ref 150–400)
RBC: 2.48 MIL/uL — AB (ref 3.87–5.11)
RDW: 21.6 % — ABNORMAL HIGH (ref 11.5–15.5)
WBC: 6 10*3/uL (ref 4.0–10.5)

## 2014-06-23 LAB — PROTIME-INR
INR: 2.85 — ABNORMAL HIGH (ref 0.00–1.49)
Prothrombin Time: 30.1 seconds — ABNORMAL HIGH (ref 11.6–15.2)

## 2014-06-24 ENCOUNTER — Other Ambulatory Visit (HOSPITAL_COMMUNITY): Payer: Self-pay

## 2014-06-24 LAB — CBC
HCT: 25.9 % — ABNORMAL LOW (ref 36.0–46.0)
Hemoglobin: 8 g/dL — ABNORMAL LOW (ref 12.0–15.0)
MCH: 29.3 pg (ref 26.0–34.0)
MCHC: 30.9 g/dL (ref 30.0–36.0)
MCV: 94.9 fL (ref 78.0–100.0)
Platelets: 207 10*3/uL (ref 150–400)
RBC: 2.73 MIL/uL — AB (ref 3.87–5.11)
RDW: 21.9 % — ABNORMAL HIGH (ref 11.5–15.5)
WBC: 6.8 10*3/uL (ref 4.0–10.5)

## 2014-06-24 LAB — COMPREHENSIVE METABOLIC PANEL
ALK PHOS: 122 U/L — AB (ref 39–117)
ALT: 25 U/L (ref 0–35)
AST: 30 U/L (ref 0–37)
Albumin: 2.1 g/dL — ABNORMAL LOW (ref 3.5–5.2)
Anion gap: 7 (ref 5–15)
BUN: 27 mg/dL — ABNORMAL HIGH (ref 6–23)
CO2: 37 meq/L — AB (ref 19–32)
Calcium: 8.6 mg/dL (ref 8.4–10.5)
Chloride: 87 mEq/L — ABNORMAL LOW (ref 96–112)
Creatinine, Ser: 0.52 mg/dL (ref 0.50–1.10)
GLUCOSE: 93 mg/dL (ref 70–99)
POTASSIUM: 3.2 meq/L — AB (ref 3.7–5.3)
SODIUM: 131 meq/L — AB (ref 137–147)
Total Bilirubin: 0.6 mg/dL (ref 0.3–1.2)
Total Protein: 5.8 g/dL — ABNORMAL LOW (ref 6.0–8.3)

## 2014-06-24 LAB — PROTIME-INR
INR: 2.87 — AB (ref 0.00–1.49)
PROTHROMBIN TIME: 30.3 s — AB (ref 11.6–15.2)

## 2014-06-25 LAB — PROTIME-INR
INR: 3.44 — ABNORMAL HIGH (ref 0.00–1.49)
Prothrombin Time: 34.9 seconds — ABNORMAL HIGH (ref 11.6–15.2)

## 2014-06-25 LAB — BASIC METABOLIC PANEL
Anion gap: 6 (ref 5–15)
BUN: 31 mg/dL — ABNORMAL HIGH (ref 6–23)
CHLORIDE: 89 meq/L — AB (ref 96–112)
CO2: 38 mEq/L — ABNORMAL HIGH (ref 19–32)
CREATININE: 0.55 mg/dL (ref 0.50–1.10)
Calcium: 8.5 mg/dL (ref 8.4–10.5)
GFR calc Af Amer: >90 mL/min (ref >90)
GFR calc non Af Amer: 89 mL/min — ABNORMAL LOW (ref >90)
Glucose, Bld: 97 mg/dL (ref 70–99)
Potassium: 3.7 mEq/L (ref 3.7–5.3)
Sodium: 133 mEq/L — ABNORMAL LOW (ref 137–147)

## 2014-06-26 LAB — HEPATIC FUNCTION PANEL
ALT: 22 U/L (ref 0–35)
AST: 28 U/L (ref 0–37)
Albumin: 2.2 g/dL — ABNORMAL LOW (ref 3.5–5.2)
Alkaline Phosphatase: 115 U/L (ref 39–117)
Bilirubin, Direct: <0.2 mg/dL (ref 0.0–0.3)
TOTAL PROTEIN: 5.8 g/dL — AB (ref 6.0–8.3)
Total Bilirubin: 0.6 mg/dL (ref 0.3–1.2)

## 2014-06-26 LAB — MAGNESIUM: MAGNESIUM: 2.1 mg/dL (ref 1.5–2.5)

## 2014-06-26 LAB — BASIC METABOLIC PANEL
ANION GAP: 8 (ref 5–15)
BUN: 35 mg/dL — ABNORMAL HIGH (ref 6–23)
CALCIUM: 8.4 mg/dL (ref 8.4–10.5)
CHLORIDE: 91 meq/L — AB (ref 96–112)
CO2: 37 meq/L — AB (ref 19–32)
Creatinine, Ser: 0.53 mg/dL (ref 0.50–1.10)
GFR calc Af Amer: >90 mL/min (ref >90)
GFR calc non Af Amer: 90 mL/min — ABNORMAL LOW (ref >90)
GLUCOSE: 93 mg/dL (ref 70–99)
POTASSIUM: 3.6 meq/L — AB (ref 3.7–5.3)
SODIUM: 136 meq/L — AB (ref 137–147)

## 2014-06-26 LAB — PHOSPHORUS: Phosphorus: 3.7 mg/dL (ref 2.3–4.6)

## 2014-06-26 LAB — TRIGLYCERIDES: TRIGLYCERIDES: 78 mg/dL (ref <150)

## 2014-06-26 LAB — PRO B NATRIURETIC PEPTIDE: Pro B Natriuretic peptide (BNP): 12599 pg/mL — ABNORMAL HIGH (ref 0–450)

## 2014-06-26 LAB — CBC
HCT: 25.2 % — ABNORMAL LOW (ref 36.0–46.0)
HEMOGLOBIN: 7.7 g/dL — AB (ref 12.0–15.0)
MCH: 29.3 pg (ref 26.0–34.0)
MCHC: 30.6 g/dL (ref 30.0–36.0)
MCV: 95.8 fL (ref 78.0–100.0)
PLATELETS: 203 10*3/uL (ref 150–400)
RBC: 2.63 MIL/uL — AB (ref 3.87–5.11)
RDW: 21.6 % — ABNORMAL HIGH (ref 11.5–15.5)
WBC: 6 10*3/uL (ref 4.0–10.5)

## 2014-06-26 LAB — PROTIME-INR
INR: 2.88 — ABNORMAL HIGH (ref 0.00–1.49)
PROTHROMBIN TIME: 30.4 s — AB (ref 11.6–15.2)

## 2014-06-26 NOTE — Progress Notes (Signed)
Name: Kristen Gutierrez MRN: [id number] DOB: 03-05-1936    ADMISSION DATE:  05/23/2014 CONSULTATION DATE:  11/18  REFERRING MD :  Laren Everts   CHIEF COMPLAINT:  Vent weaning   BRIEF PATIENT DESCRIPTION:  This is a 78 year old female who was admitted to [facility] for decompensated heart failure in the setting of known severe mitral and aortic valve stenosis. She eventually underwent mechanical aortic and mitral valve replacement on 10/16, as well as CABG x 2V. Post-op required IABP. Her hospital course was complicated by: SDH which was felt to be subacute and also felt to have acute thrombo-embolic event, hypoactive delirium, HCAP, and failure to wean. She had trach placed on 10/25. Was transferred to [facility] for weaning efforts.   SIGNIFICANT EVENTS  12/02 12 hrs t collar 12/10  ATC 8 hours, has been failing after 8 hours 12/4 currently full vent support   STUDIES:  11/26 CT Head : new cerebellar infarct  SUBJECTIVE:  Tolerating ATC x 16 hrs as of 12/20  VITAL SIGNS: Vital signs reviewed. Abnormal values will appear under impression plan section.   PHYSICAL EXAMINATION: General:  Elderly female, full vent support currently.  Neuro:  Awake, does not f/c. Right sided weakness. Generalized weakness overall. More attentive HEENT:  # 6 trach c/d/i,  Cardiovascular:  + audible click, distant tones, irregular  Lungs:  Scattered rhonchi , decreased in bases Abdomen:  Soft, tf running via peg Musculoskeletal:  Intact  Skin:  Multiple areas of ecchymosis , slowly improving    Recent Labs Lab 06/24/14 0630 06/25/14 0521 06/26/14 0606  NA 131* 133* 136*  K 3.2* 3.7 3.6*  CL 87* 89* 91*  CO2 37* 38* 37*  BUN 27* 31* 35*  CREATININE 0.52 0.55 0.53  GLUCOSE 93 97 93    Recent Labs Lab 06/23/14 0535 06/24/14 0630 06/26/14 0606  HGB 7.7* 8.0* 7.7*  HCT 23.8* 25.9* 25.2*  WBC 6.0 6.8 6.0  PLT 180 207 203   Lab Results  Component Value Date   INR 2.88* 06/26/2014   INR 3.44*  06/25/2014   INR 2.87* 06/24/2014      Dg Chest Port 1 View  06/24/2014   CLINICAL DATA:  Pneumonia, short of breath  EXAM: PORTABLE CHEST - 1 VIEW  COMPARISON:  Radiograph 06/22/2014  FINDINGS: Tracheostomy tube unchanged. Stable enlarged cardiac silhouette. There is low lung volumes and bibasilar atelectasis. Central venous congestion similar prior. Probable pleural effusions.  IMPRESSION: 1. No significant change. 2. Cardiomegaly, low lung volumes, bibasilar atelectasis, effusions, and central venous congestion.   Electronically Signed   By: Suzy Bouchard M.D.   On: 06/24/2014 18:58      ASSESSMENT / PLAN: Ventilator dependent respiratory failure  Tracheostomy status Severe AS/MS s/p both mechanical AV and MV replacement on 10/16 CAD s/p 2V CABG 10/16 Afib Stroke Subacute left SDH Hypoactive delirium Hypotension:  on Midodrine  Recurrent Pleural effusions LLL airspace disease. Completed rx for HCAP Suspect mix of atx and effusion.  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 Does not tolerate long intervals of PMV, goal to progress to > than 24 hours Cerebellar infarct should not limit weaning. Trach downsizing in future may help but would not downsize until tolerating ATC 24 hours.  Plan Cont weaning protocol per [facility], she is on full vent support currently but did T collar  12 hours 12/20.  Aggressive PT/OT, mobilize as able  SLP support  PMV trials on going Wean O2 for sats 90-94% Follow CXR intermittently   PCCM will follow M/Th unless new needs arise.   Richardson Landry Minor ACNP Maryanna Shape PCCM Pager [phone] till 3 pm If no answer page [phone] 06/26/2014, 8:47 AM   PCCM ATTENDING: Tolerating ATC x 16-20h daily - can try 24h over next 2 nights, otherwise will need nocturnal vent - Ok to proceed with vent training meantime. She will go home to  [facility] will need pulmonary FU after discharge. Would suggest prolong stay as inpatient till this can be clarified  Rigoberto Noel. MD

## 2014-06-27 LAB — PROTIME-INR
INR: 2.53 — AB (ref 0.00–1.49)
PROTHROMBIN TIME: 27.4 s — AB (ref 11.6–15.2)

## 2014-06-27 LAB — POTASSIUM: POTASSIUM: 3.2 mmol/L — AB (ref 3.5–5.1)

## 2014-06-28 ENCOUNTER — Other Ambulatory Visit (HOSPITAL_COMMUNITY): Payer: Medicare (Managed Care)

## 2014-06-28 LAB — BRAIN NATRIURETIC PEPTIDE: B NATRIURETIC PEPTIDE 5: 501.5 pg/mL — AB (ref 0.0–100.0)

## 2014-06-28 LAB — PROTIME-INR
INR: 2.5 — ABNORMAL HIGH (ref 0.00–1.49)
PROTHROMBIN TIME: 27.2 s — AB (ref 11.6–15.2)

## 2014-06-28 LAB — POTASSIUM: Potassium: 3.9 mmol/L (ref 3.5–5.1)

## 2014-07-14 ENCOUNTER — Ambulatory Visit (INDEPENDENT_AMBULATORY_CARE_PROVIDER_SITE_OTHER): Payer: Medicare HMO | Admitting: Cardiovascular Disease

## 2014-07-14 ENCOUNTER — Encounter: Payer: Self-pay | Admitting: Cardiovascular Disease

## 2014-07-14 VITALS — BP 120/80 | HR 76 | Ht 60.0 in | Wt 158.0 lb

## 2014-07-14 DIAGNOSIS — I350 Nonrheumatic aortic (valve) stenosis: Secondary | ICD-10-CM

## 2014-07-14 DIAGNOSIS — Z954 Presence of other heart-valve replacement: Secondary | ICD-10-CM

## 2014-07-14 DIAGNOSIS — J9602 Acute respiratory failure with hypercapnia: Secondary | ICD-10-CM

## 2014-07-14 DIAGNOSIS — J9612 Chronic respiratory failure with hypercapnia: Secondary | ICD-10-CM

## 2014-07-14 DIAGNOSIS — Z952 Presence of prosthetic heart valve: Secondary | ICD-10-CM

## 2014-07-14 DIAGNOSIS — I482 Chronic atrial fibrillation, unspecified: Secondary | ICD-10-CM

## 2014-07-14 NOTE — Assessment & Plan Note (Addendum)
She was discharged from [facility] without any follow up. She needs to be followed by pulmonary for her vent and for routine trach care  She still uses the vent at night. Her husband has not been able to wean her from the vent . She has not had any input from pulmonary or any trach care. I have referred her to pulmonary.  I will see her in 6 months. Time spent with patient, family, charting, discussion with pulmonary care - 75 minutes

## 2014-07-14 NOTE — Patient Instructions (Signed)
You have been referred to Pulmonary - we will call you Monday with appointment information  Your physician recommends that you continue on your current medications as directed. Please refer to the Current Medication list given to you today.  Your physician wants you to follow-up in: 6 months with Dr. Acie Fredrickson.  You will receive a reminder letter in the mail two months in advance. If you don't receive a letter, please call our office to schedule the follow-up appointment.

## 2014-07-14 NOTE — Assessment & Plan Note (Signed)
She remains in atrial fib. Rate is fairly well controlled. She is on coumadin for her mechanical mitral valve.  At this point, I do not think she needs a cardioversion and in fact, I do not think she needs to be put asleep in order to perform this cardioversion  Her main issue is being partially vent dependent ( uses the vent at night)  She needs a referral to pulmonary medicine She will need home health nursing to assess the vent and she will also need trach care.  I will see her in 6 months . Continue same meds.
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
## 2014-07-15 DIAGNOSIS — Z952 Presence of prosthetic heart valve: Secondary | ICD-10-CM | POA: Insufficient documentation

## 2014-07-15 NOTE — Assessment & Plan Note (Signed)
She has a mechanical mitral valve Coumadin has been managed by Cyndi Bender, PA

## 2014-07-15 NOTE — Assessment & Plan Note (Signed)
S/p aortic valve replacement ( bioprosthetic AV)  She has a mechanical mitral valve and needs coumadin

## 2014-07-20 ENCOUNTER — Other Ambulatory Visit (HOSPITAL_COMMUNITY): Payer: Self-pay | Admitting: Family Medicine

## 2014-07-20 DIAGNOSIS — R1310 Dysphagia, unspecified: Secondary | ICD-10-CM

## 2014-07-21 ENCOUNTER — Encounter (INDEPENDENT_AMBULATORY_CARE_PROVIDER_SITE_OTHER): Payer: Self-pay

## 2014-07-21 ENCOUNTER — Other Ambulatory Visit (INDEPENDENT_AMBULATORY_CARE_PROVIDER_SITE_OTHER): Payer: Medicare HMO

## 2014-07-21 ENCOUNTER — Ambulatory Visit (INDEPENDENT_AMBULATORY_CARE_PROVIDER_SITE_OTHER)
Admission: RE | Admit: 2014-07-21 | Discharge: 2014-07-21 | Disposition: A | Discharge status: Home / Self Care | Payer: Medicare HMO | Source: Ambulatory Visit | Attending: Adult Health | Admitting: Adult Health

## 2014-07-21 ENCOUNTER — Ambulatory Visit (INDEPENDENT_AMBULATORY_CARE_PROVIDER_SITE_OTHER): Payer: Medicare HMO | Admitting: Adult Health

## 2014-07-21 VITALS — BP 110/78 | HR 94 | Temp 98.0°F | Ht 60.0 in | Wt 140.0 lb

## 2014-07-21 DIAGNOSIS — J9602 Acute respiratory failure with hypercapnia: Secondary | ICD-10-CM

## 2014-07-21 DIAGNOSIS — Z930 Tracheostomy status: Secondary | ICD-10-CM

## 2014-07-21 DIAGNOSIS — J90 Pleural effusion, not elsewhere classified: Secondary | ICD-10-CM

## 2014-07-21 DIAGNOSIS — S2239XK Fracture of one rib, unspecified side, subsequent encounter for fracture with nonunion: Secondary | ICD-10-CM

## 2014-07-21 LAB — BASIC METABOLIC PANEL
BUN: 52 mg/dL — AB (ref 6–23)
CO2: 32 mEq/L (ref 19–32)
Calcium: 9.6 mg/dL (ref 8.4–10.5)
Chloride: 95 mEq/L — ABNORMAL LOW (ref 96–112)
Creatinine, Ser: 0.79 mg/dL (ref 0.40–1.20)
GFR: 75.05 mL/min (ref >60.00)
Glucose, Bld: 93 mg/dL (ref 70–99)
Potassium: 3.9 mEq/L (ref 3.5–5.1)
Sodium: 136 mEq/L (ref 135–145)

## 2014-07-21 LAB — CBC WITH DIFFERENTIAL/PLATELET
Basophils Absolute: 0 10*3/uL (ref 0.0–0.1)
Basophils Relative: 0.4 % (ref 0.0–3.0)
EOS ABS: 0.3 10*3/uL (ref 0.0–0.7)
Eosinophils Relative: 3 % (ref 0.0–5.0)
HEMATOCRIT: 34.6 % — AB (ref 36.0–46.0)
HEMOGLOBIN: 11.4 g/dL — AB (ref 12.0–15.0)
LYMPHS PCT: 16.2 % (ref 12.0–46.0)
Lymphs Abs: 1.8 10*3/uL (ref 0.7–4.0)
MCHC: 32.9 g/dL (ref 30.0–36.0)
MCV: 93.6 fl (ref 78.0–100.0)
MONO ABS: 0.9 10*3/uL (ref 0.1–1.0)
MONOS PCT: 7.9 % (ref 3.0–12.0)
NEUTROS PCT: 72.5 % (ref 43.0–77.0)
Neutro Abs: 7.9 10*3/uL — ABNORMAL HIGH (ref 1.4–7.7)
Platelets: 306 10*3/uL (ref 150.0–400.0)
RBC: 3.69 Mil/uL — ABNORMAL LOW (ref 3.87–5.11)
RDW: 17.8 % — ABNORMAL HIGH (ref 11.5–15.5)
WBC: 10.9 10*3/uL — AB (ref 4.0–10.5)

## 2014-07-21 NOTE — Progress Notes (Signed)
   Subjective:    Patient ID: Kristen Gutierrez, female    DOB: February 27, 1936, 79 y.o.   MRN: [id number]  HPI  2.5l/m O2 trach  Vent at night  TF cont 21 hr peg  OT/PT at home  RN at home.  Walking with walker with PT ,  Starting to be able to stand from sitting pos.    Review of Systems     Objective:   Physical Exam        Assessment & Plan:

## 2014-07-21 NOTE — Patient Instructions (Addendum)
We are setting you up for the trach clinic .  Chest xray today  Labs today  Continue on Trach collar daytime and Vent at night .  Please contact office for sooner follow up if symptoms do not improve or worsen or seek emergency care  Follow up Dr. Melvyn Novas  Next week and As needed
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
## 2014-07-24 DIAGNOSIS — J90 Pleural effusion, not elsewhere classified: Secondary | ICD-10-CM | POA: Insufficient documentation

## 2014-07-24 NOTE — Assessment & Plan Note (Signed)
Chronic Trach/Vent dependent   Plan  We are setting you up for the trach clinic .  Chest xray today  Labs today  Continue on Trach collar daytime and Vent at night .  Please contact office for sooner follow up if symptoms do not improve or worsen or seek emergency care  Follow up Dr. Melvyn Novas  Next week and As needed

## 2014-07-24 NOTE — Assessment & Plan Note (Signed)
We are setting you up for the trach clinic .  Chest xray today  Labs today  Continue on Trach collar daytime and Vent at night .  Please contact office for sooner follow up if symptoms do not improve or worsen or seek emergency care  Follow up Dr. Melvyn Novas  Next week and As needed

## 2014-07-24 NOTE — Assessment & Plan Note (Signed)
Recurrent pleural effusion   Plan  Resolution of left base atelectasis with only a tiny residual left pleural effusion.on cxr

## 2014-07-25 ENCOUNTER — Ambulatory Visit (HOSPITAL_COMMUNITY)
Admission: RE | Admit: 2014-07-25 | Discharge: 2014-07-25 | Disposition: A | Discharge status: Home / Self Care | Payer: Medicare HMO | Source: Ambulatory Visit | Attending: Family Medicine | Admitting: Family Medicine

## 2014-07-25 DIAGNOSIS — R1310 Dysphagia, unspecified: Secondary | ICD-10-CM | POA: Insufficient documentation

## 2014-07-25 NOTE — Procedures (Signed)
Objective Swallowing Evaluation: Modified Barium Swallowing Study  Patient Details  Name: Kristen Gutierrez MRN: [id number] Date of Birth: 10/25/1935  Today's Date: 07/25/2014 Time: SLP Start Time (ACUTE ONLY): 1148-SLP Stop Time (ACUTE ONLY): 1220 SLP Time Calculation (min) (ACUTE ONLY): 32 min  Past Medical History:  Past Medical History  Diagnosis Date  . Hypertension   . Paroxysmal atrial fibrillation     a. s/p DCCV in [place] 11/2013  . CAD (coronary artery disease)     a. LHC (6/15 - [place]):  ostial RCA 80%, prox LAD 80% then 70%, ostial CFX 50%  . Aortic stenosis     a. Echo (5/15 - [place] in [place]):  EF 60%, severe AS (AVA 0.5 cm, peak velocity 3.4 m), severe mitral stenosis (mean 13 mm Hg)  . Breast cancer     s/p lumpectomy and radiation   Past Surgical History:  Past Surgical History  Procedure Laterality Date  . Breast surgery     HPI:  HPI: 79 year old female who was admitted to [facility] for decompensated heart failure in the setting of known severe mitral and aortic valve stenosis. She eventually underwent mechanical aortic and mitral valve replacement on 10/16, as well as CABG x 2V. Post-op required IABP. Her hospital course was complicated by: SDH which was felt to be subacute and also felt to have acute thrombo-embolic event, hypoactive delirium, HCAP, and failure to wean. She had trach placed on 10/25. Was transferred to [facility] for weaning efforts and subsequently D/Cd home.  Continues with trach and vent support at night.  Has referral for trach clinic. Currently with #6 Shiley - vent for hour hours at night.     No Data Recorded  Assessment / Plan / Recommendation CHL IP CLINICAL IMPRESSIONS 07/25/2014  Dysphagia Diagnosis WFL  Clinical impression Pt presents with normal oropharyngeal swallow.  Consistencies were consumed under conditions with and without PMV.   Pt with sufficient mastication, timely  swallow response, and no aspiration despite large, consecutive thin liquid boluses.  Screen of esophagus revealed retention of barium initially; clearance improved as assessment progressed.  13 mm barium pill swallowed without incident. Educated pt/family re: results and recs.  They watched video in real time.  Recommend regular consistency diet, thin liquids, meds whole with water.  Family will f/u with physician re: plan for enteral feeds (ceasing or weaning).  No SLP f/u is warranted.        CHL IP TREATMENT RECOMMENDATION 07/25/2014  Treatment Plan Recommendations No treatment recommended at this time     CHL IP DIET RECOMMENDATION 07/25/2014  Diet Recommendations Regular;Thin liquid  Liquid Administration via Cup;Straw  Medication Administration Whole meds with liquid  Compensations (None)  Postural Changes and/or Swallow Maneuvers (None)     CHL IP OTHER RECOMMENDATIONS 07/25/2014  Recommended Consults (None)  Oral Care Recommendations Oral care BID  Other Recommendations (None)     CHL IP FOLLOW UP RECOMMENDATIONS 07/25/2014  Follow up Recommendations None     No flowsheet data found.       SLP Swallow Goals No flowsheet data found.  No flowsheet data found.    CHL IP REASON FOR REFERRAL 07/25/2014  Reason for Referral Objectively evaluate swallowing function     CHL IP ORAL PHASE 07/25/2014  Lips (None)  Tongue (None)  Mucous membranes (None)  Nutritional status (None)  Other (None)  Oxygen therapy (None)  Oral Phase WFL  Oral -  Pudding Teaspoon (None)  Oral - Pudding Cup (None)  Oral - Honey Teaspoon (None)  Oral - Honey Cup (None)  Oral - Honey Syringe (None)  Oral - Nectar Teaspoon (None)  Oral - Nectar Cup (None)  Oral - Nectar Straw (None)  Oral - Nectar Syringe (None)  Oral - Ice Chips (None)  Oral - Thin Teaspoon (None)  Oral - Thin Cup (None)  Oral - Thin Straw (None)  Oral - Thin Syringe (None)  Oral - Puree (None)  Oral - Mechanical Soft (None)   Oral - Regular (None)  Oral - Multi-consistency (None)  Oral - Pill (None)  Oral Phase - Comment (None)      CHL IP PHARYNGEAL PHASE 07/25/2014  Pharyngeal Phase WFL  Pharyngeal - Pudding Teaspoon (None)  Penetration/Aspiration details (pudding teaspoon) (None)  Pharyngeal - Pudding Cup (None)  Penetration/Aspiration details (pudding cup) (None)  Pharyngeal - Honey Teaspoon (None)  Penetration/Aspiration details (honey teaspoon) (None)  Pharyngeal - Honey Cup (None)  Penetration/Aspiration details (honey cup) (None)  Pharyngeal - Honey Syringe (None)  Penetration/Aspiration details (honey syringe) (None)  Pharyngeal - Nectar Teaspoon (None)  Penetration/Aspiration details (nectar teaspoon) (None)  Pharyngeal - Nectar Cup (None)  Penetration/Aspiration details (nectar cup) (None)  Pharyngeal - Nectar Straw (None)  Penetration/Aspiration details (nectar straw) (None)  Pharyngeal - Nectar Syringe (None)  Penetration/Aspiration details (nectar syringe) (None)  Pharyngeal - Ice Chips (None)  Penetration/Aspiration details (ice chips) (None)  Pharyngeal - Thin Teaspoon (None)  Penetration/Aspiration details (thin teaspoon) (None)  Pharyngeal - Thin Cup (None)  Penetration/Aspiration details (thin cup) (None)  Pharyngeal - Thin Straw (None)  Penetration/Aspiration details (thin straw) (None)  Pharyngeal - Thin Syringe (None)  Penetration/Aspiration details (thin syringe') (None)  Pharyngeal - Puree (None)  Penetration/Aspiration details (puree) (None)  Pharyngeal - Mechanical Soft (None)  Penetration/Aspiration details (mechanical soft) (None)  Pharyngeal - Regular (None)  Penetration/Aspiration details (regular) (None)  Pharyngeal - Multi-consistency (None)  Penetration/Aspiration details (multi-consistency) (None)  Pharyngeal - Pill (None)  Penetration/Aspiration details (pill) (None)  Pharyngeal Comment (None)     CHL IP CERVICAL ESOPHAGEAL PHASE 07/25/2014  Cervical  Esophageal Phase Impaired  Pudding Teaspoon (None)  Pudding Cup (None)  Honey Teaspoon (None)  Honey Cup (None)  Honey Syringe (None)  Nectar Teaspoon (None)  Nectar Cup (None)  Nectar Straw (None)  Nectar Syringe (None)  Thin Teaspoon (None)  Thin Cup (None)  Thin Straw (None)  Thin Syringe (None)  Cervical Esophageal Comment barium retention intially; clearance improved as study progressed    CHL IP GO 07/25/2014  Functional Assessment Tool Used clinical judgement  Functional Limitations Swallowing  Swallow Current Status (P2951) [place]  Swallow Goal Status (O8416) [facility]  Swallow Discharge Status (S0630) [place]  Motor Speech Current Status (Z6010) (None)  Motor Speech Goal Status (X3235) (None)  Motor Speech Goal Status (T7322) (None)  Spoken Language Comprehension Current Status (G2542) (None)  Spoken Language Comprehension Goal Status (H0623) (None)  Spoken Language Comprehension Discharge Status (J6283) (None)  Spoken Language Expression Current Status (T5176) (None)  Spoken Language Expression Goal Status (H6073) (None)  Spoken Language Expression Discharge Status [phone]) (None)  Attention Current Status (I9485) (None)  Attention Goal Status (I6270) (None)  Attention Discharge Status (J5009) (None)  Memory Current Status (F8182) (None)  Memory Goal Status (X9371) (None)  Memory Discharge Status (I9678) (None)  Voice Current Status (L3810) (None)  Voice Goal Status (F7510) (None)  Voice Discharge Status (C5852) (None)  Other Speech-Language Pathology Functional Limitation [phone]) (None)  Other Speech-Language Pathology Functional Limitation Goal Status [phone]) (None)  Other Speech-Language Pathology Functional Limitation Discharge Status [phone]) (None)           Assunta Curtis 07/25/2014, 12:34 PM Montrell Cessna L. Tivis Ringer, [place] CCC/SLP Pager [phone]

## 2014-07-26 NOTE — Progress Notes (Signed)
Quick Note:  ATC pt - line rang multiple times with no answer then automated message stated that "the wireless customer you are trying to reach is not available. Please try your call again later." WCB ______

## 2014-07-26 NOTE — Progress Notes (Signed)
Quick Note:  ATC pt - line rang multiple times with no answer then automated message stated that "the wireless customer you are trying to reach is not available. Please try your call again later." WCB. ______

## 2014-07-27 ENCOUNTER — Encounter: Payer: Self-pay | Admitting: Internal Medicine

## 2014-07-27 ENCOUNTER — Ambulatory Visit (INDEPENDENT_AMBULATORY_CARE_PROVIDER_SITE_OTHER): Payer: Medicare HMO | Admitting: Internal Medicine

## 2014-07-27 ENCOUNTER — Telehealth: Payer: Self-pay | Admitting: Internal Medicine

## 2014-07-27 VITALS — BP 118/70 | HR 99 | Temp 98.9°F

## 2014-07-27 DIAGNOSIS — Z930 Tracheostomy status: Secondary | ICD-10-CM

## 2014-07-27 DIAGNOSIS — J9611 Chronic respiratory failure with hypoxia: Secondary | ICD-10-CM

## 2014-07-27 DIAGNOSIS — J9620 Acute and chronic respiratory failure, unspecified whether with hypoxia or hypercapnia: Secondary | ICD-10-CM

## 2014-07-27 DIAGNOSIS — R05 Cough: Secondary | ICD-10-CM

## 2014-07-27 DIAGNOSIS — Z931 Gastrostomy status: Secondary | ICD-10-CM

## 2014-07-27 DIAGNOSIS — R059 Cough, unspecified: Secondary | ICD-10-CM

## 2014-07-27 DIAGNOSIS — I10 Essential (primary) hypertension: Secondary | ICD-10-CM

## 2014-07-27 MED ORDER — VALSARTAN 80 MG PO TABS: 80.0000 mg | ORAL_TABLET | Freq: Every day | ORAL | Status: DC

## 2014-07-27 MED ORDER — FUROSEMIDE 40 MG PO TABS: 40.0000 mg | ORAL_TABLET | Freq: Every day | ORAL | Status: AC

## 2014-07-27 NOTE — Telephone Encounter (Signed)
Spoke with daughter [name]. She reports she was todl by [employer] she needs to change pt inner canula daily to help prevent infection. She wants [place] to send an order to [facility] for shiley bran #4 dic 30 day supply. Please advise MW thanks

## 2014-07-27 NOTE — Telephone Encounter (Signed)
Fine with me on both

## 2014-07-27 NOTE — Telephone Encounter (Signed)
lmomtcb x1 for amy 

## 2014-07-27 NOTE — Telephone Encounter (Signed)
Rx for Valsartan needs to be called into [facility] [address] Fax # [phone], Please call [name] (patients daughter) at [phone] when this is done so she can pick up rx
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
## 2014-07-27 NOTE — Patient Instructions (Addendum)
Stop lisinopril and the cough should resolve  For cough in meantime rec mucinex dm  Up to 1200 mg every 12h as needed Stop pm lasix dose Start diovan (valsartan) 80 mg (one half daily if too strong)  Ok to try off ventilator at night to see how you do with your breathing and your daytime get up and go / strength for rehab   Please see patient coordinator before you leave today  to schedule GI eval for stomach tube evaluation asap   Use nystatin powder to rash twice daily and see Cyndi Bender in 2 weeks if not better   Please schedule a follow up office visit in 4 weeks, sooner if needed

## 2014-07-27 NOTE — Telephone Encounter (Signed)
Daughter aware order placed and RX sent in. Nothing further needed

## 2014-07-28 DIAGNOSIS — R05 Cough: Secondary | ICD-10-CM | POA: Insufficient documentation

## 2014-07-28 DIAGNOSIS — R059 Cough, unspecified: Secondary | ICD-10-CM | POA: Insufficient documentation

## 2014-07-28 NOTE — Assessment & Plan Note (Signed)
Passed swallowing eval 07/25/14 so stopped using - 07/27/2014 referred to GI to d/c

## 2014-07-28 NOTE — Assessment & Plan Note (Signed)
She does fine with trach occluded completely, her cxr is improved, and she's tolerating noct vent poorly x 4 h per night due to cough so takes it off herself anyway  rec trial off noct vent and observe how she does dung the day with alertness, energy and monitoring sats noct on present 02 per trach collar

## 2014-07-28 NOTE — Assessment & Plan Note (Signed)
The most common causes of chronic cough in immunocompetent adults include the following: upper airway cough syndrome (UACS), previously referred to as postnasal drip syndrome (PNDS), which is caused by variety of rhinosinus conditions; (2) asthma; (3) GERD; (4) chronic bronchitis from cigarette smoking or other inhaled environmental irritants; (5) nonasthmatic eosinophilic bronchitis; and (6) bronchiectasis.   These conditions, singly or in combination, have accounted for up to 94% of the causes of chronic cough in prospective studies.   Other conditions have constituted no >6% of the causes in prospective studies These have included bronchogenic carcinoma, chronic interstitial pneumonia, sarcoidosis, left ventricular failure, ACEI-induced cough, and aspiration from a condition associated with pharyngeal dysfunction.    Chronic cough is often simultaneously caused by more than one condition. A single cause has been found from 38 to 82% of the time, multiple causes from 18 to 62%. Multiply caused cough has been the result of three diseases up to 42% of the time.       Most likely this is combination of trach / ACEi cough > will downsize trach and consider d/c if not needing noct vent (see chronic resp failure) and try off acei (try HBP)

## 2014-07-28 NOTE — Assessment & Plan Note (Signed)
ACE inhibitors are problematic in  pts with airway complaints because  even experienced pulmonologists can't always distinguish ace effects from copd/asthma.  By themselves they don't actually cause a problem, much like oxygen can't by itself start a fire, but they certainly serve as a powerful catalyst or enhancer for any "fire"  or inflammatory process in the upper airway, be it caused by an ET  tube or more commonly reflux (especially in the obese or pts with known GERD or who are on biphoshonates).    In the era of ARB near equivalency until we have a better handle on the reversibility of the airway problem, it just makes sense to avoid ACEI  Entirely for at least the next 6 weeks, this is the only way to know how much the acei is contributing to her upper airway symptoms which of course may aggravate our plans to remove the trach soon.

## 2014-07-28 NOTE — Assessment & Plan Note (Addendum)
-  Trach placed 04/30/14 [facility]  Changed to #4 portex 07/27/14 s difficulty, min bleeding at site

## 2014-08-21 ENCOUNTER — Ambulatory Visit (HOSPITAL_COMMUNITY): Payer: Self-pay

## 2014-08-24 ENCOUNTER — Ambulatory Visit: Payer: Self-pay | Admitting: Internal Medicine

## 2015-04-04 IMAGING — CR DG CHEST 1V PORT
1 series · 1 of 1 positions shown · non-contrast
Comparison: 06/20/2014.

CLINICAL DATA: Respiratory failure.

EXAM:
PORTABLE CHEST - 1 VIEW

[AP]
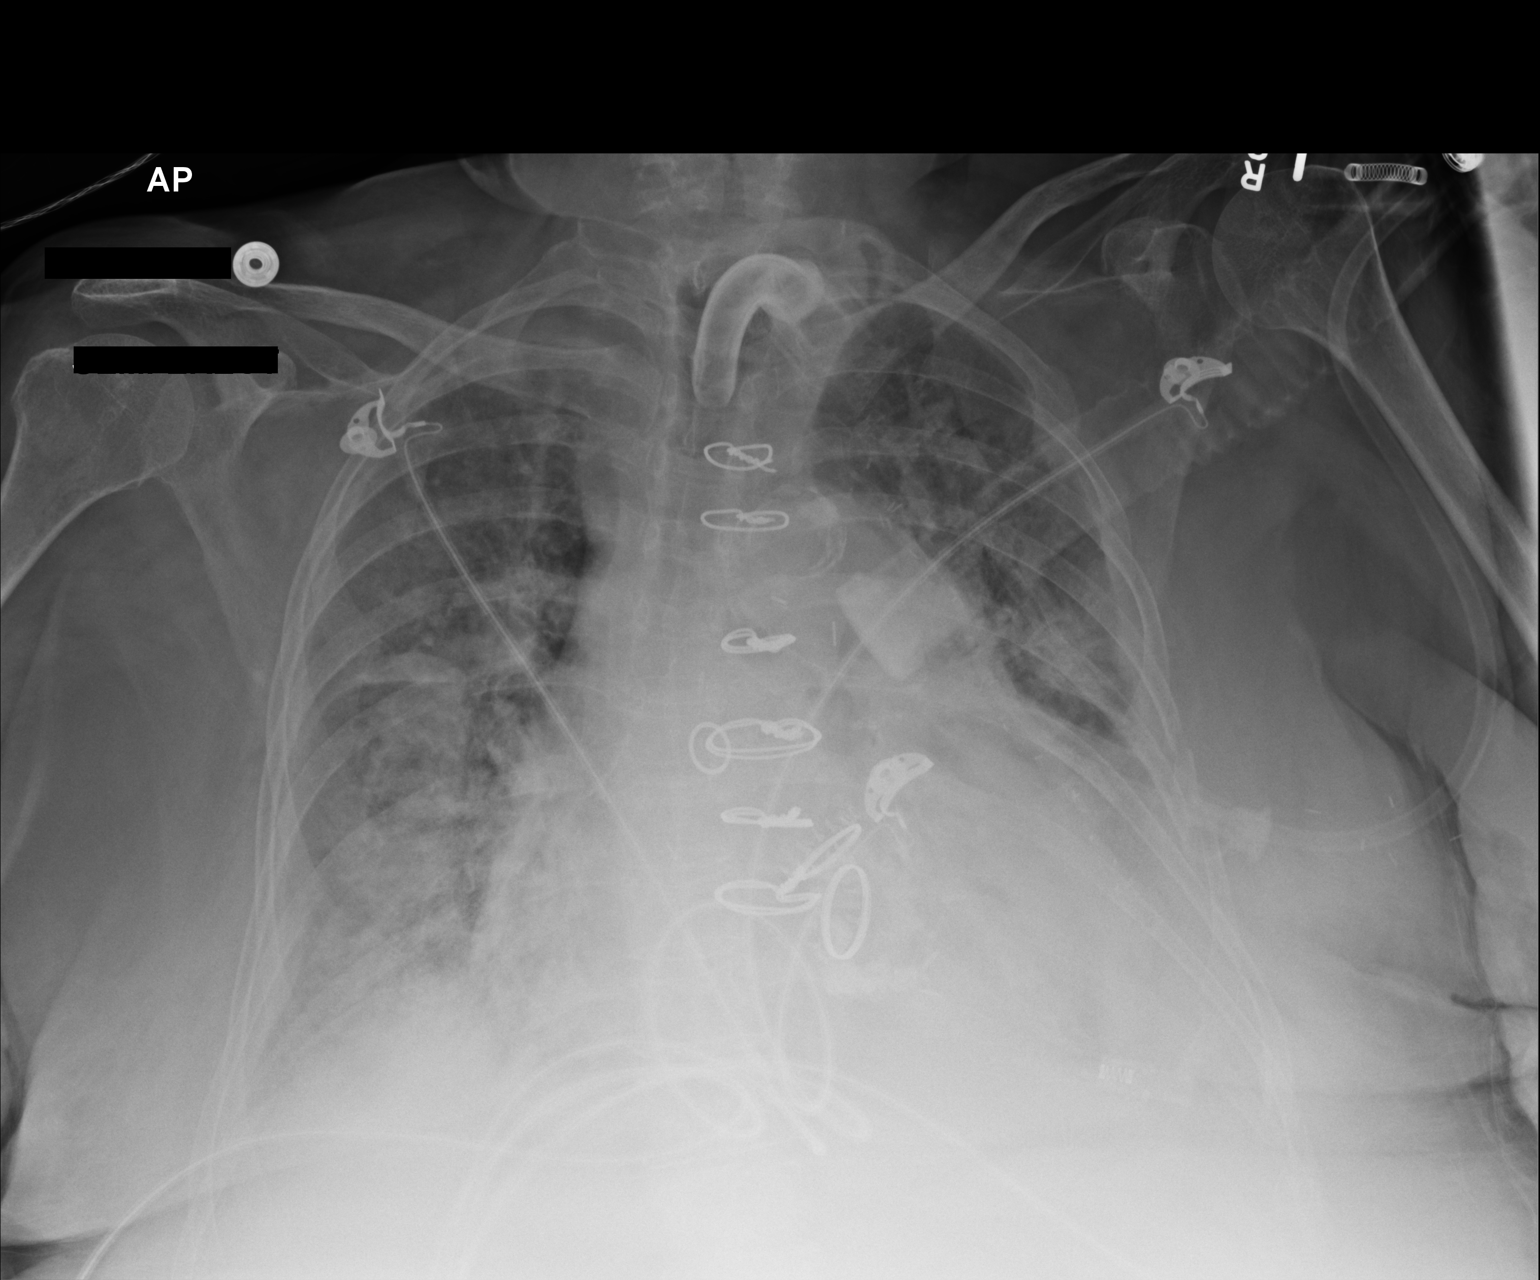

[1 of 1 positions shown; findings below may reference images not displayed]

FINDINGS: Tracheostomy tube in stable position. Stable severe cardiomegaly.
Prior cardiac valve replacement and CABG. Persistent bilateral
pulmonary infiltrates unchanged. Bilateral small pleural effusions.
Congestive heart failure could present this fashion. Basilar
pneumonia cannot be excluded. No pneumothorax. No acute osseus
abnormality .
IMPRESSION: 1. Tracheostomy tube in stable position.
2. Persistent severe cardiomegaly. Prior cardiac valve replacement
and CABG.
3. Persistent prominent bilateral pulmonary infiltrates, unchanged.
Bilateral small pleural effusions, unchanged. Congestive heart
failure could present in this fashion. Bibasilar pneumonia cannot be
excluded .

## 2015-04-06 IMAGING — CR DG CHEST 1V PORT
1 series · 1 of 1 positions shown · non-contrast
Comparison: Radiograph 06/22/2014

CLINICAL DATA: Pneumonia, short of breath

EXAM:
PORTABLE CHEST - 1 VIEW

[AP]
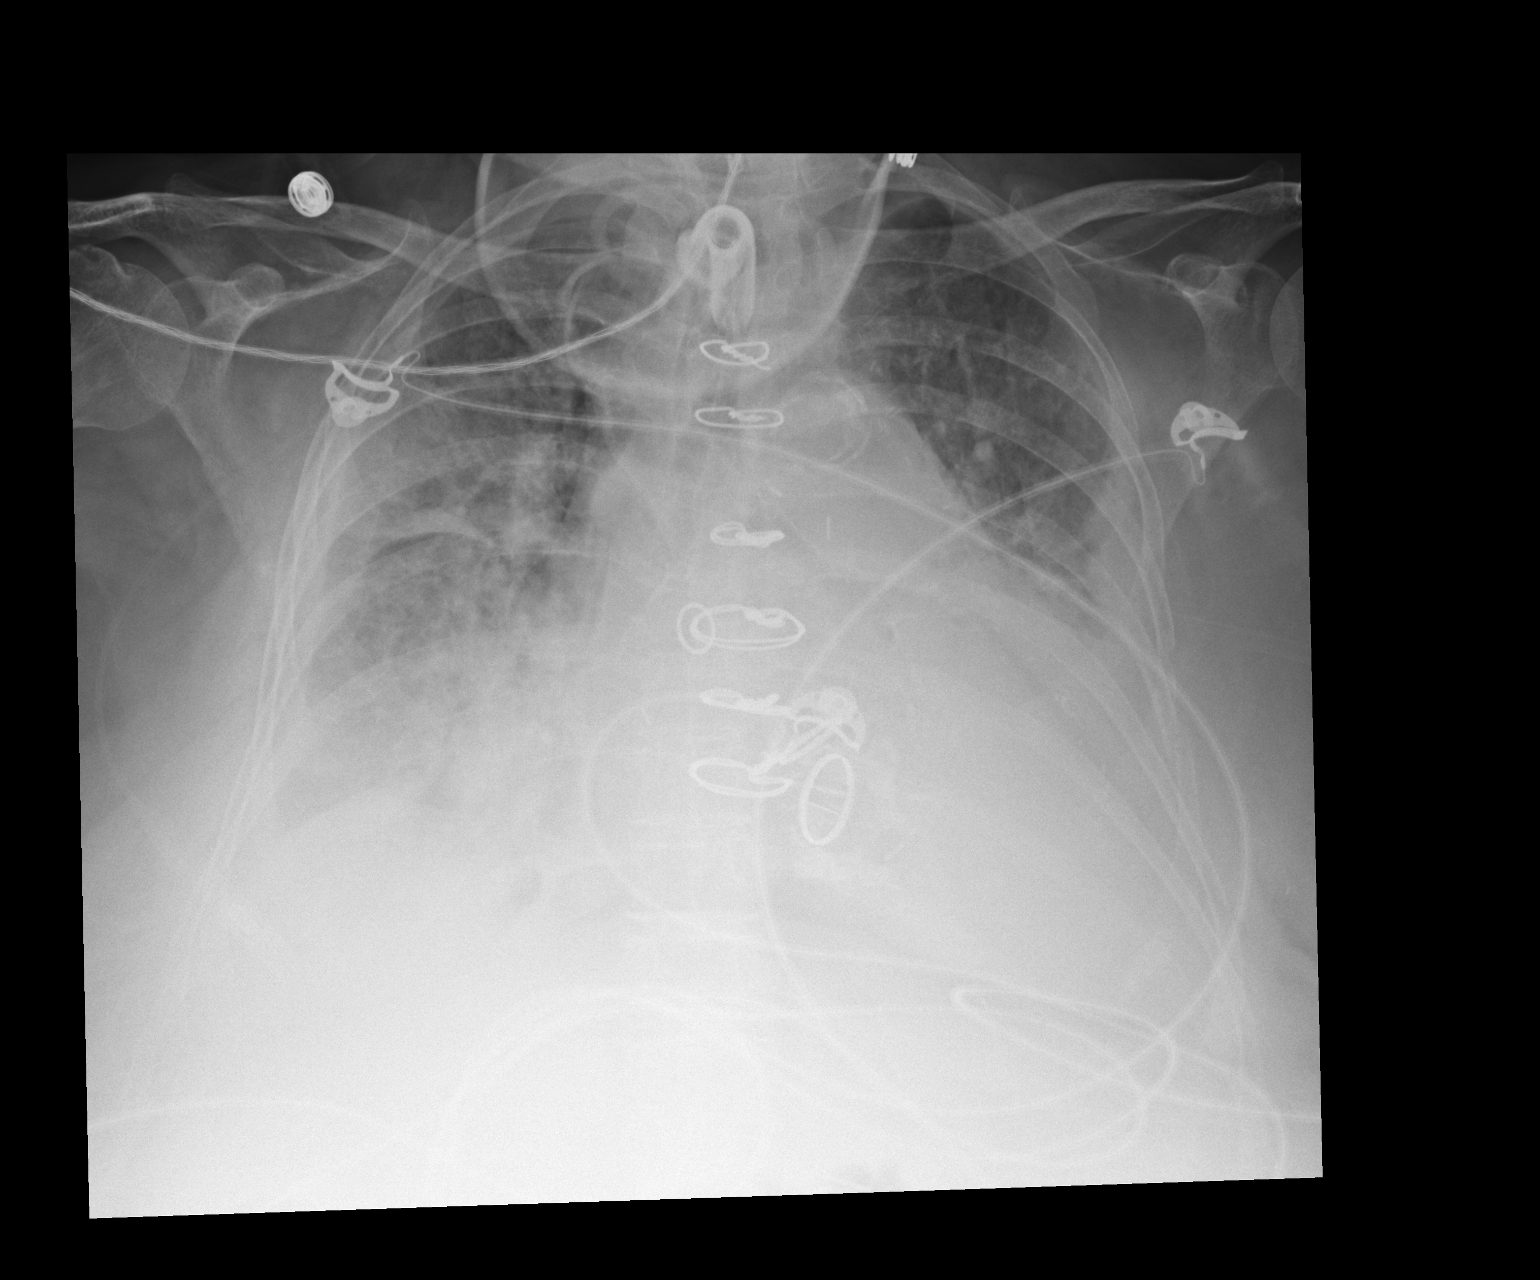

[1 of 1 positions shown; findings below may reference images not displayed]

FINDINGS: Tracheostomy tube unchanged. Stable enlarged cardiac silhouette.
There is low lung volumes and bibasilar atelectasis. Central venous
congestion similar prior. Probable pleural effusions.
IMPRESSION: 1. No significant change.
2. Cardiomegaly, low lung volumes, bibasilar atelectasis, effusions,
and central venous congestion.

## 2015-04-10 IMAGING — CR DG CHEST 1V PORT
1 series · 1 of 1 positions shown · non-contrast
Comparison: 06/24/2014.

CLINICAL DATA: 78-year-old female with respiratory failure. History
of breast cancer, hypertension and aortic stenosis. Subsequent
encounter.

EXAM:
PORTABLE CHEST - 1 VIEW

[portable]
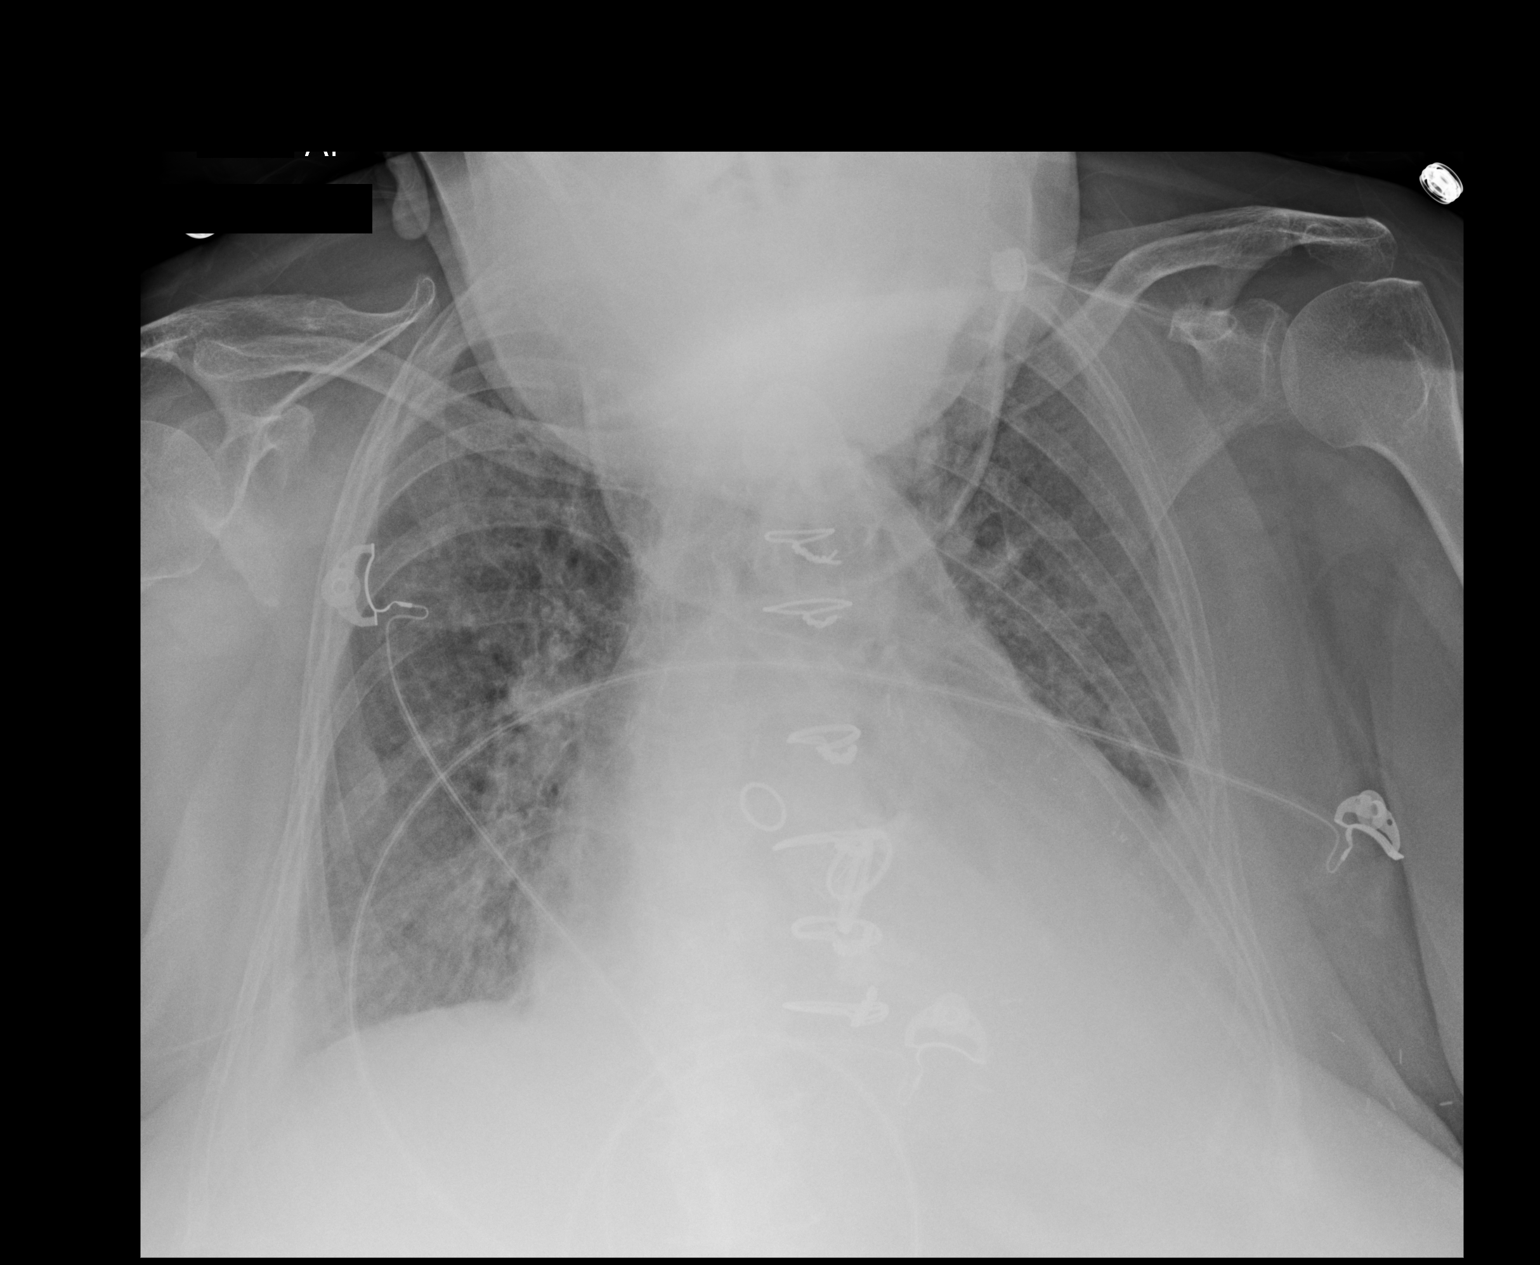

[1 of 1 positions shown; findings below may reference images not displayed]

FINDINGS: Cardiomegaly post CABG.

Pulmonary vascular congestion with improvement from the prior exam.

Consolidation left base may represent atelectasis and/ pleural
effusion. Limited for excluding underlying infiltrate or mass.

Tracheostomy tube tip midline at least 3.7 cm above the carina.

No gross pneumothorax. Chin overlies the lung apices limiting
evaluation.
IMPRESSION: Decrease in degree of pulmonary vascular congestion. Please see
above.

## 2015-04-23 DIAGNOSIS — I10 Essential (primary) hypertension: Secondary | ICD-10-CM | POA: Diagnosis not present

## 2015-04-27 DIAGNOSIS — I10 Essential (primary) hypertension: Secondary | ICD-10-CM | POA: Diagnosis not present

## 2015-04-27 DIAGNOSIS — J9801 Acute bronchospasm: Secondary | ICD-10-CM | POA: Diagnosis not present

## 2015-04-27 DIAGNOSIS — Q230 Congenital stenosis of aortic valve: Secondary | ICD-10-CM | POA: Diagnosis not present

## 2015-04-27 DIAGNOSIS — I2510 Atherosclerotic heart disease of native coronary artery without angina pectoris: Secondary | ICD-10-CM | POA: Diagnosis not present

## 2015-04-30 DIAGNOSIS — I482 Chronic atrial fibrillation: Secondary | ICD-10-CM | POA: Diagnosis not present

## 2015-04-30 DIAGNOSIS — Z952 Presence of prosthetic heart valve: Secondary | ICD-10-CM | POA: Diagnosis not present

## 2015-05-02 ENCOUNTER — Other Ambulatory Visit: Payer: Self-pay

## 2015-05-02 DIAGNOSIS — Z1231 Encounter for screening mammogram for malignant neoplasm of breast: Secondary | ICD-10-CM

## 2015-05-03 IMAGING — CR DG CHEST 2V
2 series · 2 of 2 positions shown · non-contrast
Comparison: Chest x-rays dated 06/28/2014 and 01/23/2014

CLINICAL DATA: Acute respiratory failure with hypercapnia. Pleural
effusion.

EXAM:
CHEST  2 VIEW

[view not recorded (1 of 2)]
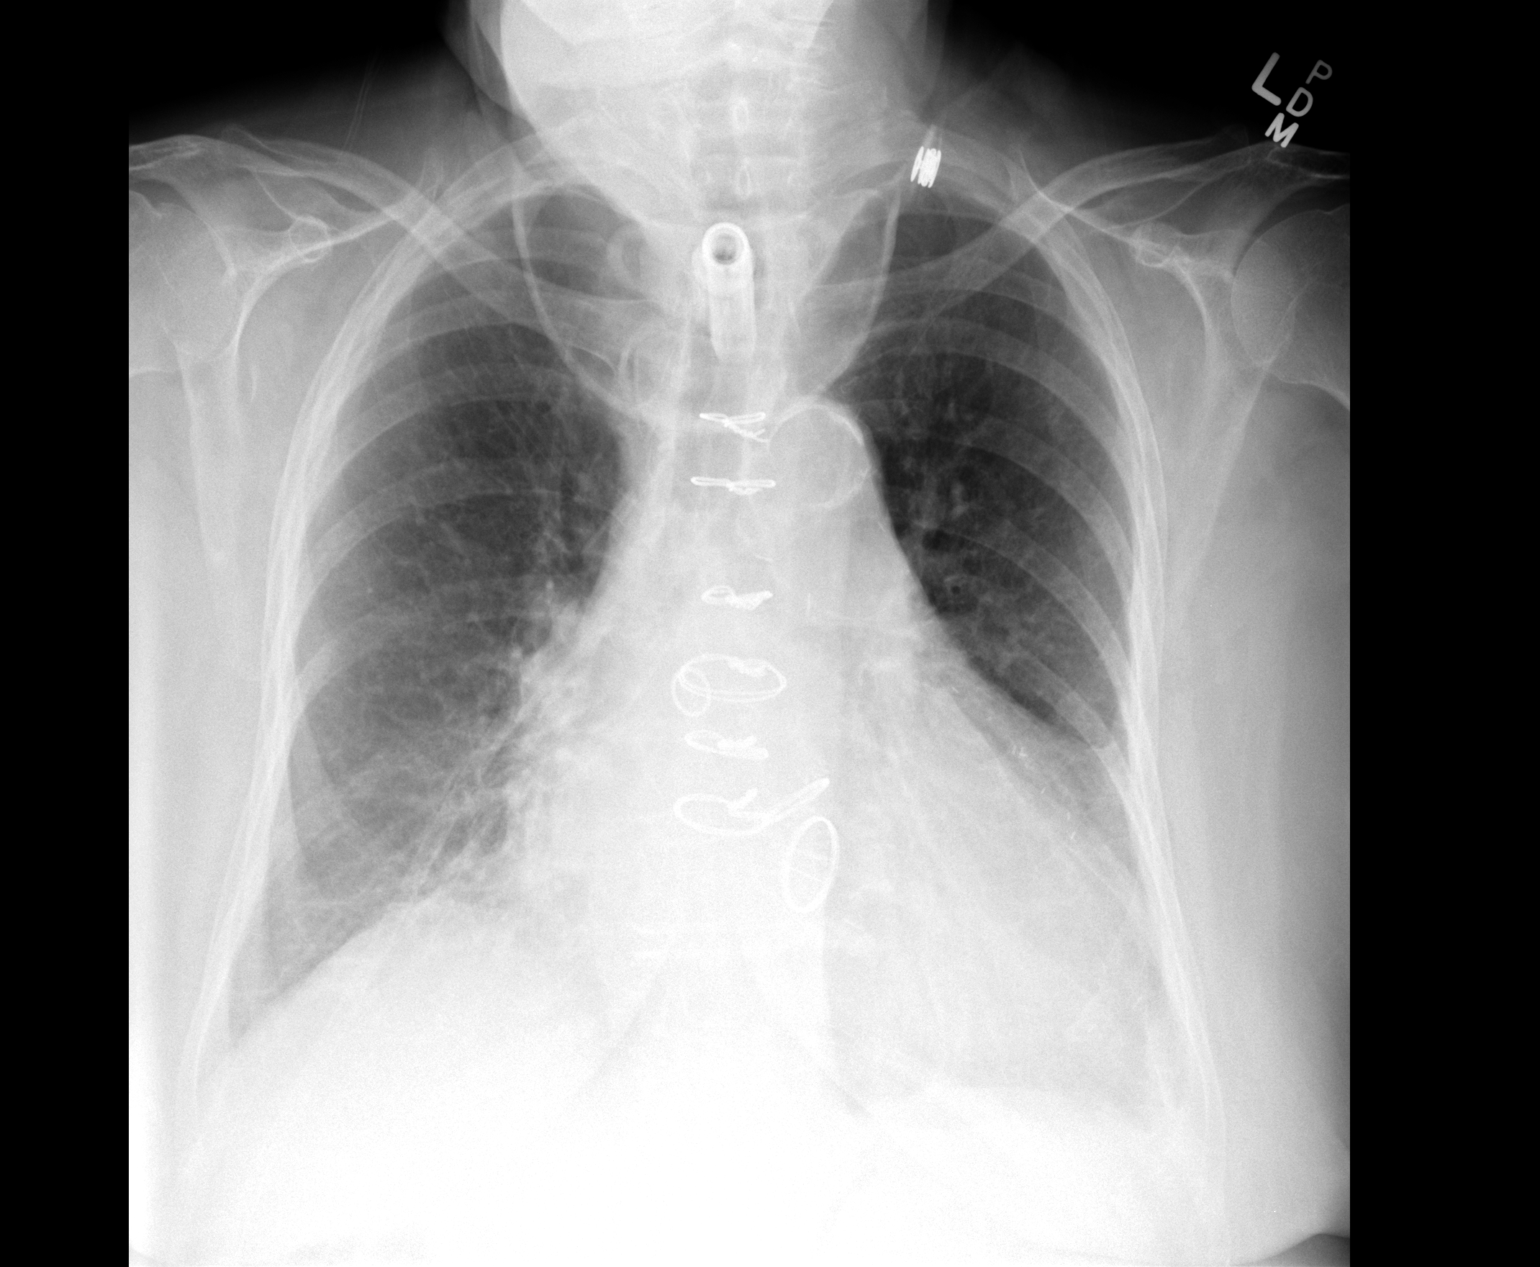

[view not recorded (2 of 2)]
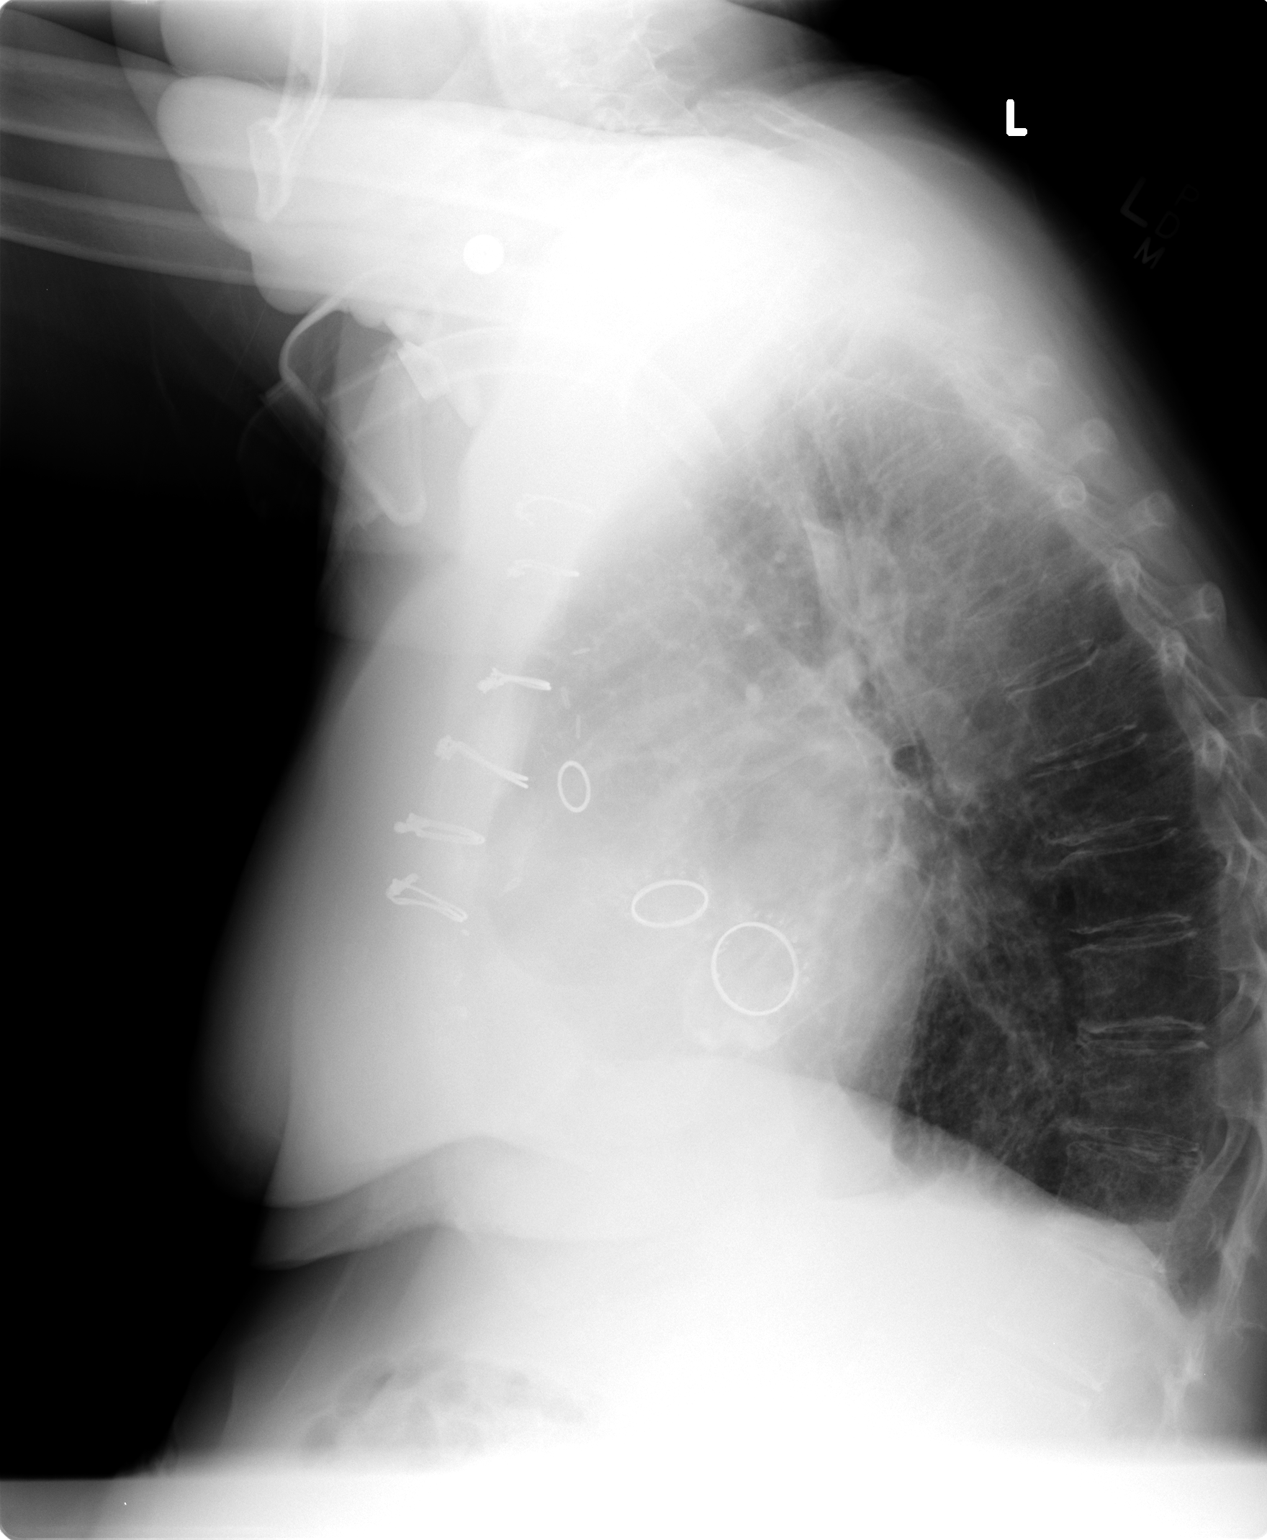

[2 of 2 positions shown; findings below may reference images not displayed]

FINDINGS: Tracheostomy tube in good position. Evidence of CABG and aortic and
mitral valve replacements.

The left pleural effusion has almost completely resolved.
Atelectasis at the left base has completely resolved. There is
chronic cardiomegaly. Pulmonary vascularity is normal. No
pneumothorax. No osseous abnormality. The lungs are clear.
IMPRESSION: Resolution of left base atelectasis with only a tiny residual left
pleural effusion.

## 2015-05-24 ENCOUNTER — Ambulatory Visit
Admission: RE | Admit: 2015-05-24 | Discharge: 2015-05-24 | Disposition: A | Discharge status: Home / Self Care | Payer: Medicare HMO | Source: Ambulatory Visit

## 2015-05-24 ENCOUNTER — Other Ambulatory Visit: Payer: Self-pay

## 2015-05-24 DIAGNOSIS — Z1231 Encounter for screening mammogram for malignant neoplasm of breast: Secondary | ICD-10-CM

## 2015-05-24 DIAGNOSIS — Z01419 Encounter for gynecological examination (general) (routine) without abnormal findings: Secondary | ICD-10-CM | POA: Diagnosis not present

## 2015-05-24 DIAGNOSIS — R928 Other abnormal and inconclusive findings on diagnostic imaging of breast: Secondary | ICD-10-CM | POA: Diagnosis not present

## 2015-05-24 DIAGNOSIS — I10 Essential (primary) hypertension: Secondary | ICD-10-CM | POA: Diagnosis not present

## 2015-05-28 DIAGNOSIS — Z952 Presence of prosthetic heart valve: Secondary | ICD-10-CM | POA: Diagnosis not present

## 2015-05-28 DIAGNOSIS — I482 Chronic atrial fibrillation: Secondary | ICD-10-CM | POA: Diagnosis not present

## 2015-06-20 DIAGNOSIS — Z23 Encounter for immunization: Secondary | ICD-10-CM | POA: Diagnosis not present

## 2015-06-20 DIAGNOSIS — I2510 Atherosclerotic heart disease of native coronary artery without angina pectoris: Secondary | ICD-10-CM | POA: Diagnosis not present

## 2015-06-20 DIAGNOSIS — R69 Illness, unspecified: Secondary | ICD-10-CM | POA: Diagnosis not present

## 2015-06-20 DIAGNOSIS — Z6830 Body mass index (BMI) 30.0-30.9, adult: Secondary | ICD-10-CM | POA: Diagnosis not present

## 2015-06-20 DIAGNOSIS — Z952 Presence of prosthetic heart valve: Secondary | ICD-10-CM | POA: Diagnosis not present

## 2015-06-20 DIAGNOSIS — M810 Age-related osteoporosis without current pathological fracture: Secondary | ICD-10-CM | POA: Diagnosis not present

## 2015-06-20 DIAGNOSIS — I4891 Unspecified atrial fibrillation: Secondary | ICD-10-CM | POA: Diagnosis not present

## 2015-06-20 DIAGNOSIS — E039 Hypothyroidism, unspecified: Secondary | ICD-10-CM | POA: Diagnosis not present

## 2015-06-20 DIAGNOSIS — E669 Obesity, unspecified: Secondary | ICD-10-CM | POA: Diagnosis not present

## 2015-06-20 DIAGNOSIS — Z1389 Encounter for screening for other disorder: Secondary | ICD-10-CM | POA: Diagnosis not present

## 2015-06-23 DIAGNOSIS — I10 Essential (primary) hypertension: Secondary | ICD-10-CM | POA: Diagnosis not present

## 2015-06-25 DIAGNOSIS — Z952 Presence of prosthetic heart valve: Secondary | ICD-10-CM | POA: Diagnosis not present

## 2015-06-25 DIAGNOSIS — I482 Chronic atrial fibrillation: Secondary | ICD-10-CM | POA: Diagnosis not present

## 2015-07-10 DIAGNOSIS — Z23 Encounter for immunization: Secondary | ICD-10-CM | POA: Diagnosis not present

## 2015-07-10 DIAGNOSIS — N390 Urinary tract infection, site not specified: Secondary | ICD-10-CM | POA: Diagnosis not present

## 2015-07-10 DIAGNOSIS — S0083XA Contusion of other part of head, initial encounter: Secondary | ICD-10-CM | POA: Diagnosis not present

## 2015-07-10 DIAGNOSIS — Z952 Presence of prosthetic heart valve: Secondary | ICD-10-CM | POA: Diagnosis not present

## 2015-07-10 DIAGNOSIS — R05 Cough: Secondary | ICD-10-CM | POA: Diagnosis not present

## 2015-07-10 DIAGNOSIS — J9610 Chronic respiratory failure, unspecified whether with hypoxia or hypercapnia: Secondary | ICD-10-CM | POA: Diagnosis not present

## 2015-07-23 DIAGNOSIS — I482 Chronic atrial fibrillation: Secondary | ICD-10-CM | POA: Diagnosis not present

## 2015-07-23 DIAGNOSIS — Z952 Presence of prosthetic heart valve: Secondary | ICD-10-CM | POA: Diagnosis not present

## 2015-07-24 DIAGNOSIS — I10 Essential (primary) hypertension: Secondary | ICD-10-CM | POA: Diagnosis not present

## 2015-07-31 DIAGNOSIS — E039 Hypothyroidism, unspecified: Secondary | ICD-10-CM | POA: Diagnosis not present

## 2015-07-31 DIAGNOSIS — Z79899 Other long term (current) drug therapy: Secondary | ICD-10-CM | POA: Diagnosis not present

## 2015-07-31 DIAGNOSIS — M810 Age-related osteoporosis without current pathological fracture: Secondary | ICD-10-CM | POA: Diagnosis not present

## 2015-08-20 DIAGNOSIS — Z952 Presence of prosthetic heart valve: Secondary | ICD-10-CM | POA: Diagnosis not present

## 2015-08-20 DIAGNOSIS — I482 Chronic atrial fibrillation: Secondary | ICD-10-CM | POA: Diagnosis not present

## 2015-08-24 DIAGNOSIS — I10 Essential (primary) hypertension: Secondary | ICD-10-CM | POA: Diagnosis not present

## 2015-09-03 DIAGNOSIS — R791 Abnormal coagulation profile: Secondary | ICD-10-CM | POA: Diagnosis not present

## 2015-09-10 DIAGNOSIS — Z7901 Long term (current) use of anticoagulants: Secondary | ICD-10-CM | POA: Diagnosis not present

## 2015-09-21 DIAGNOSIS — I10 Essential (primary) hypertension: Secondary | ICD-10-CM | POA: Diagnosis not present

## 2015-09-26 ENCOUNTER — Other Ambulatory Visit: Payer: Self-pay | Admitting: Nurse Practitioner

## 2015-09-28 ENCOUNTER — Other Ambulatory Visit: Payer: Self-pay | Admitting: Nurse Practitioner

## 2015-10-01 DIAGNOSIS — R69 Illness, unspecified: Secondary | ICD-10-CM | POA: Diagnosis not present

## 2015-10-01 DIAGNOSIS — Z952 Presence of prosthetic heart valve: Secondary | ICD-10-CM | POA: Diagnosis not present

## 2015-10-01 DIAGNOSIS — I482 Chronic atrial fibrillation: Secondary | ICD-10-CM | POA: Diagnosis not present

## 2015-10-22 DIAGNOSIS — I10 Essential (primary) hypertension: Secondary | ICD-10-CM | POA: Diagnosis not present

## 2015-10-24 DIAGNOSIS — E039 Hypothyroidism, unspecified: Secondary | ICD-10-CM | POA: Diagnosis not present

## 2015-10-24 DIAGNOSIS — I4891 Unspecified atrial fibrillation: Secondary | ICD-10-CM | POA: Diagnosis not present

## 2015-10-24 DIAGNOSIS — E7800 Pure hypercholesterolemia, unspecified: Secondary | ICD-10-CM | POA: Diagnosis not present

## 2015-10-24 DIAGNOSIS — Z6832 Body mass index (BMI) 32.0-32.9, adult: Secondary | ICD-10-CM | POA: Diagnosis not present

## 2015-10-24 DIAGNOSIS — I2510 Atherosclerotic heart disease of native coronary artery without angina pectoris: Secondary | ICD-10-CM | POA: Diagnosis not present

## 2015-10-24 DIAGNOSIS — J9610 Chronic respiratory failure, unspecified whether with hypoxia or hypercapnia: Secondary | ICD-10-CM | POA: Diagnosis not present

## 2015-10-24 DIAGNOSIS — Z952 Presence of prosthetic heart valve: Secondary | ICD-10-CM | POA: Diagnosis not present

## 2015-10-24 DIAGNOSIS — Z79899 Other long term (current) drug therapy: Secondary | ICD-10-CM | POA: Diagnosis not present

## 2015-10-24 DIAGNOSIS — C50912 Malignant neoplasm of unspecified site of left female breast: Secondary | ICD-10-CM | POA: Diagnosis not present

## 2015-10-29 DIAGNOSIS — Z952 Presence of prosthetic heart valve: Secondary | ICD-10-CM | POA: Diagnosis not present

## 2015-10-29 DIAGNOSIS — I482 Chronic atrial fibrillation: Secondary | ICD-10-CM | POA: Diagnosis not present

## 2015-11-21 DIAGNOSIS — I10 Essential (primary) hypertension: Secondary | ICD-10-CM | POA: Diagnosis not present

## 2015-11-26 DIAGNOSIS — Z952 Presence of prosthetic heart valve: Secondary | ICD-10-CM | POA: Diagnosis not present

## 2015-11-26 DIAGNOSIS — I482 Chronic atrial fibrillation: Secondary | ICD-10-CM | POA: Diagnosis not present

## 2015-12-22 DIAGNOSIS — I10 Essential (primary) hypertension: Secondary | ICD-10-CM | POA: Diagnosis not present

## 2015-12-24 DIAGNOSIS — I482 Chronic atrial fibrillation: Secondary | ICD-10-CM | POA: Diagnosis not present

## 2015-12-24 DIAGNOSIS — Z952 Presence of prosthetic heart valve: Secondary | ICD-10-CM | POA: Diagnosis not present

## 2016-01-21 DIAGNOSIS — I10 Essential (primary) hypertension: Secondary | ICD-10-CM | POA: Diagnosis not present

## 2016-01-21 DIAGNOSIS — I482 Chronic atrial fibrillation: Secondary | ICD-10-CM | POA: Diagnosis not present

## 2016-01-21 DIAGNOSIS — Z952 Presence of prosthetic heart valve: Secondary | ICD-10-CM | POA: Diagnosis not present

## 2016-02-18 DIAGNOSIS — Z952 Presence of prosthetic heart valve: Secondary | ICD-10-CM | POA: Diagnosis not present

## 2016-02-18 DIAGNOSIS — I482 Chronic atrial fibrillation: Secondary | ICD-10-CM | POA: Diagnosis not present

## 2016-02-21 DIAGNOSIS — I10 Essential (primary) hypertension: Secondary | ICD-10-CM | POA: Diagnosis not present

## 2016-02-27 DIAGNOSIS — I2510 Atherosclerotic heart disease of native coronary artery without angina pectoris: Secondary | ICD-10-CM | POA: Diagnosis not present

## 2016-02-27 DIAGNOSIS — W57XXXA Bitten or stung by nonvenomous insect and other nonvenomous arthropods, initial encounter: Secondary | ICD-10-CM | POA: Diagnosis not present

## 2016-02-27 DIAGNOSIS — Z1281 Encounter for screening for malignant neoplasm of oral cavity: Secondary | ICD-10-CM | POA: Diagnosis not present

## 2016-02-27 DIAGNOSIS — C50912 Malignant neoplasm of unspecified site of left female breast: Secondary | ICD-10-CM | POA: Diagnosis not present

## 2016-02-27 DIAGNOSIS — I4891 Unspecified atrial fibrillation: Secondary | ICD-10-CM | POA: Diagnosis not present

## 2016-02-27 DIAGNOSIS — Z952 Presence of prosthetic heart valve: Secondary | ICD-10-CM | POA: Diagnosis not present

## 2016-02-27 DIAGNOSIS — R350 Frequency of micturition: Secondary | ICD-10-CM | POA: Diagnosis not present

## 2016-02-27 DIAGNOSIS — J9610 Chronic respiratory failure, unspecified whether with hypoxia or hypercapnia: Secondary | ICD-10-CM | POA: Diagnosis not present

## 2016-02-27 DIAGNOSIS — Z79899 Other long term (current) drug therapy: Secondary | ICD-10-CM | POA: Diagnosis not present

## 2016-02-27 DIAGNOSIS — E7800 Pure hypercholesterolemia, unspecified: Secondary | ICD-10-CM | POA: Diagnosis not present

## 2016-02-27 DIAGNOSIS — E039 Hypothyroidism, unspecified: Secondary | ICD-10-CM | POA: Diagnosis not present

## 2016-02-27 DIAGNOSIS — Z9181 History of falling: Secondary | ICD-10-CM | POA: Diagnosis not present

## 2016-03-06 DIAGNOSIS — Z0100 Encounter for examination of eyes and vision without abnormal findings: Secondary | ICD-10-CM | POA: Diagnosis not present

## 2016-03-06 DIAGNOSIS — H524 Presbyopia: Secondary | ICD-10-CM | POA: Diagnosis not present

## 2016-03-17 DIAGNOSIS — I482 Chronic atrial fibrillation: Secondary | ICD-10-CM | POA: Diagnosis not present

## 2016-03-17 DIAGNOSIS — Z952 Presence of prosthetic heart valve: Secondary | ICD-10-CM | POA: Diagnosis not present

## 2016-03-23 DIAGNOSIS — I10 Essential (primary) hypertension: Secondary | ICD-10-CM | POA: Diagnosis not present

## 2016-04-14 DIAGNOSIS — I482 Chronic atrial fibrillation: Secondary | ICD-10-CM | POA: Diagnosis not present

## 2016-04-14 DIAGNOSIS — Z952 Presence of prosthetic heart valve: Secondary | ICD-10-CM | POA: Diagnosis not present

## 2016-04-22 DIAGNOSIS — I10 Essential (primary) hypertension: Secondary | ICD-10-CM | POA: Diagnosis not present

## 2016-04-29 ENCOUNTER — Other Ambulatory Visit: Payer: Self-pay | Admitting: Physician Assistant

## 2016-04-29 DIAGNOSIS — Z853 Personal history of malignant neoplasm of breast: Secondary | ICD-10-CM

## 2016-05-01 DIAGNOSIS — N390 Urinary tract infection, site not specified: Secondary | ICD-10-CM | POA: Diagnosis not present

## 2016-05-01 DIAGNOSIS — Z23 Encounter for immunization: Secondary | ICD-10-CM | POA: Diagnosis not present

## 2016-05-08 DIAGNOSIS — R69 Illness, unspecified: Secondary | ICD-10-CM | POA: Diagnosis not present

## 2016-05-12 DIAGNOSIS — Z952 Presence of prosthetic heart valve: Secondary | ICD-10-CM | POA: Diagnosis not present

## 2016-05-12 DIAGNOSIS — I482 Chronic atrial fibrillation: Secondary | ICD-10-CM | POA: Diagnosis not present

## 2016-05-19 DIAGNOSIS — N390 Urinary tract infection, site not specified: Secondary | ICD-10-CM | POA: Diagnosis not present

## 2016-05-20 ENCOUNTER — Telehealth: Payer: Self-pay | Admitting: Cardiovascular Disease

## 2016-05-20 DIAGNOSIS — J9610 Chronic respiratory failure, unspecified whether with hypoxia or hypercapnia: Secondary | ICD-10-CM | POA: Diagnosis not present

## 2016-05-20 DIAGNOSIS — R0689 Other abnormalities of breathing: Secondary | ICD-10-CM | POA: Diagnosis not present

## 2016-05-20 NOTE — Telephone Encounter (Signed)
Reiterated to patient's husband Dr. Elmarie Shiley recommendations to see PCP today for evaluation. He was grateful for follow-up.

## 2016-05-20 NOTE — Telephone Encounter (Signed)
This sounds like a COPD  / pulmonary issue. Will defer to her primary MD

## 2016-05-20 NOTE — Telephone Encounter (Signed)
New message  1. No chest pain 2. Chest is making noises 3. Started about 4 am 4. Noises lasted for 5 min./loud 5. No nitro

## 2016-05-20 NOTE — Telephone Encounter (Signed)
Patient's husband called to report that the patient's chest was "whistling" this morning around 0430 for about 5-6 minutes.  The noise woke the patient up - and continued for a few minutes after.  He reports she had no CP or SOB during the episode, but it sounded like a "puppy crying right in the middle of her chest."  He states the patient wears 2L O2 qhs, but she was not having breathing problems during the episode and her pulse ox read 96%. He states the patient does not snore, and she was awake when they heard the noise. He reports he can sometimes hear her valve click, but has never heard this noise before.  He in convinced there is an alarm on the valve to alert them something is wrong.  The noise has not been audible again since it "turned off" this morning.  He called her PCP and made an appointment this afternoon at 1500. He was told from the PCP that the noise was probably wheezing, but he states he "knows what wheezing sounds like and this is not the same thing." He requests Dr. Elmarie Shiley recommendations as soon as possible.

## 2016-05-23 DIAGNOSIS — I10 Essential (primary) hypertension: Secondary | ICD-10-CM | POA: Diagnosis not present

## 2016-06-03 DIAGNOSIS — H2511 Age-related nuclear cataract, right eye: Secondary | ICD-10-CM | POA: Diagnosis not present

## 2016-06-03 DIAGNOSIS — H02839 Dermatochalasis of unspecified eye, unspecified eyelid: Secondary | ICD-10-CM | POA: Diagnosis not present

## 2016-06-03 DIAGNOSIS — H18413 Arcus senilis, bilateral: Secondary | ICD-10-CM | POA: Diagnosis not present

## 2016-06-03 DIAGNOSIS — H2513 Age-related nuclear cataract, bilateral: Secondary | ICD-10-CM | POA: Diagnosis not present

## 2016-06-09 DIAGNOSIS — Z952 Presence of prosthetic heart valve: Secondary | ICD-10-CM | POA: Diagnosis not present

## 2016-06-09 DIAGNOSIS — I482 Chronic atrial fibrillation: Secondary | ICD-10-CM | POA: Diagnosis not present

## 2016-06-10 ENCOUNTER — Ambulatory Visit
Admission: RE | Admit: 2016-06-10 | Discharge: 2016-06-10 | Disposition: A | Discharge status: Home / Self Care | Payer: Medicare HMO | Source: Ambulatory Visit | Attending: Physician Assistant | Admitting: Physician Assistant

## 2016-06-10 DIAGNOSIS — R921 Mammographic calcification found on diagnostic imaging of breast: Secondary | ICD-10-CM | POA: Diagnosis not present

## 2016-06-10 DIAGNOSIS — Z853 Personal history of malignant neoplasm of breast: Secondary | ICD-10-CM

## 2016-06-22 DIAGNOSIS — I10 Essential (primary) hypertension: Secondary | ICD-10-CM | POA: Diagnosis not present

## 2016-07-02 DIAGNOSIS — R1310 Dysphagia, unspecified: Secondary | ICD-10-CM | POA: Diagnosis not present

## 2016-07-02 DIAGNOSIS — C50912 Malignant neoplasm of unspecified site of left female breast: Secondary | ICD-10-CM | POA: Diagnosis not present

## 2016-07-02 DIAGNOSIS — E7800 Pure hypercholesterolemia, unspecified: Secondary | ICD-10-CM | POA: Diagnosis not present

## 2016-07-02 DIAGNOSIS — I2510 Atherosclerotic heart disease of native coronary artery without angina pectoris: Secondary | ICD-10-CM | POA: Diagnosis not present

## 2016-07-02 DIAGNOSIS — Z79899 Other long term (current) drug therapy: Secondary | ICD-10-CM | POA: Diagnosis not present

## 2016-07-02 DIAGNOSIS — R69 Illness, unspecified: Secondary | ICD-10-CM | POA: Diagnosis not present

## 2016-07-02 DIAGNOSIS — E039 Hypothyroidism, unspecified: Secondary | ICD-10-CM | POA: Diagnosis not present

## 2016-07-02 DIAGNOSIS — J9610 Chronic respiratory failure, unspecified whether with hypoxia or hypercapnia: Secondary | ICD-10-CM | POA: Diagnosis not present

## 2016-07-02 DIAGNOSIS — Z1389 Encounter for screening for other disorder: Secondary | ICD-10-CM | POA: Diagnosis not present

## 2016-07-02 DIAGNOSIS — Z952 Presence of prosthetic heart valve: Secondary | ICD-10-CM | POA: Diagnosis not present

## 2016-07-02 DIAGNOSIS — I4891 Unspecified atrial fibrillation: Secondary | ICD-10-CM | POA: Diagnosis not present

## 2016-07-07 DIAGNOSIS — Z952 Presence of prosthetic heart valve: Secondary | ICD-10-CM | POA: Diagnosis not present

## 2016-07-07 DIAGNOSIS — I482 Chronic atrial fibrillation: Secondary | ICD-10-CM | POA: Diagnosis not present

## 2016-07-23 DIAGNOSIS — I10 Essential (primary) hypertension: Secondary | ICD-10-CM | POA: Diagnosis not present

## 2016-08-04 DIAGNOSIS — Z952 Presence of prosthetic heart valve: Secondary | ICD-10-CM | POA: Diagnosis not present

## 2016-08-04 DIAGNOSIS — I482 Chronic atrial fibrillation: Secondary | ICD-10-CM | POA: Diagnosis not present

## 2016-08-23 DIAGNOSIS — I10 Essential (primary) hypertension: Secondary | ICD-10-CM | POA: Diagnosis not present

## 2016-08-25 DIAGNOSIS — H2513 Age-related nuclear cataract, bilateral: Secondary | ICD-10-CM | POA: Diagnosis not present

## 2016-08-25 DIAGNOSIS — H2511 Age-related nuclear cataract, right eye: Secondary | ICD-10-CM | POA: Diagnosis not present

## 2016-08-26 DIAGNOSIS — H2512 Age-related nuclear cataract, left eye: Secondary | ICD-10-CM | POA: Diagnosis not present

## 2016-09-01 DIAGNOSIS — I482 Chronic atrial fibrillation: Secondary | ICD-10-CM | POA: Diagnosis not present

## 2016-09-01 DIAGNOSIS — Z952 Presence of prosthetic heart valve: Secondary | ICD-10-CM | POA: Diagnosis not present

## 2016-09-10 DIAGNOSIS — Z1211 Encounter for screening for malignant neoplasm of colon: Secondary | ICD-10-CM | POA: Diagnosis not present

## 2016-09-10 DIAGNOSIS — Z1212 Encounter for screening for malignant neoplasm of rectum: Secondary | ICD-10-CM | POA: Diagnosis not present

## 2016-09-20 DIAGNOSIS — I10 Essential (primary) hypertension: Secondary | ICD-10-CM | POA: Diagnosis not present

## 2016-09-22 DIAGNOSIS — H2512 Age-related nuclear cataract, left eye: Secondary | ICD-10-CM | POA: Diagnosis not present

## 2016-09-22 DIAGNOSIS — H2513 Age-related nuclear cataract, bilateral: Secondary | ICD-10-CM | POA: Diagnosis not present

## 2016-09-29 DIAGNOSIS — I482 Chronic atrial fibrillation: Secondary | ICD-10-CM | POA: Diagnosis not present

## 2016-09-29 DIAGNOSIS — Z952 Presence of prosthetic heart valve: Secondary | ICD-10-CM | POA: Diagnosis not present

## 2016-10-21 DIAGNOSIS — I10 Essential (primary) hypertension: Secondary | ICD-10-CM | POA: Diagnosis not present

## 2016-10-27 DIAGNOSIS — Z952 Presence of prosthetic heart valve: Secondary | ICD-10-CM | POA: Diagnosis not present

## 2016-10-27 DIAGNOSIS — I482 Chronic atrial fibrillation: Secondary | ICD-10-CM | POA: Diagnosis not present

## 2016-11-03 DIAGNOSIS — I4891 Unspecified atrial fibrillation: Secondary | ICD-10-CM | POA: Diagnosis not present

## 2016-11-03 DIAGNOSIS — N390 Urinary tract infection, site not specified: Secondary | ICD-10-CM | POA: Diagnosis not present

## 2016-11-03 DIAGNOSIS — E039 Hypothyroidism, unspecified: Secondary | ICD-10-CM | POA: Diagnosis not present

## 2016-11-03 DIAGNOSIS — E7800 Pure hypercholesterolemia, unspecified: Secondary | ICD-10-CM | POA: Diagnosis not present

## 2016-11-03 DIAGNOSIS — R69 Illness, unspecified: Secondary | ICD-10-CM | POA: Diagnosis not present

## 2016-11-03 DIAGNOSIS — M810 Age-related osteoporosis without current pathological fracture: Secondary | ICD-10-CM | POA: Diagnosis not present

## 2016-11-03 DIAGNOSIS — I2510 Atherosclerotic heart disease of native coronary artery without angina pectoris: Secondary | ICD-10-CM | POA: Diagnosis not present

## 2016-11-03 DIAGNOSIS — Z79899 Other long term (current) drug therapy: Secondary | ICD-10-CM | POA: Diagnosis not present

## 2016-11-03 DIAGNOSIS — R262 Difficulty in walking, not elsewhere classified: Secondary | ICD-10-CM | POA: Diagnosis not present

## 2016-11-03 DIAGNOSIS — Z952 Presence of prosthetic heart valve: Secondary | ICD-10-CM | POA: Diagnosis not present

## 2016-11-04 DIAGNOSIS — R269 Unspecified abnormalities of gait and mobility: Secondary | ICD-10-CM | POA: Diagnosis not present

## 2016-11-04 DIAGNOSIS — M6281 Muscle weakness (generalized): Secondary | ICD-10-CM | POA: Diagnosis not present

## 2016-11-04 DIAGNOSIS — R262 Difficulty in walking, not elsewhere classified: Secondary | ICD-10-CM | POA: Diagnosis not present

## 2016-11-06 DIAGNOSIS — R262 Difficulty in walking, not elsewhere classified: Secondary | ICD-10-CM | POA: Diagnosis not present

## 2016-11-06 DIAGNOSIS — M6281 Muscle weakness (generalized): Secondary | ICD-10-CM | POA: Diagnosis not present

## 2016-11-06 DIAGNOSIS — R269 Unspecified abnormalities of gait and mobility: Secondary | ICD-10-CM | POA: Diagnosis not present

## 2016-11-10 DIAGNOSIS — R269 Unspecified abnormalities of gait and mobility: Secondary | ICD-10-CM | POA: Diagnosis not present

## 2016-11-10 DIAGNOSIS — M6281 Muscle weakness (generalized): Secondary | ICD-10-CM | POA: Diagnosis not present

## 2016-11-10 DIAGNOSIS — R262 Difficulty in walking, not elsewhere classified: Secondary | ICD-10-CM | POA: Diagnosis not present

## 2016-11-17 DIAGNOSIS — R269 Unspecified abnormalities of gait and mobility: Secondary | ICD-10-CM | POA: Diagnosis not present

## 2016-11-17 DIAGNOSIS — R262 Difficulty in walking, not elsewhere classified: Secondary | ICD-10-CM | POA: Diagnosis not present

## 2016-11-17 DIAGNOSIS — M6281 Muscle weakness (generalized): Secondary | ICD-10-CM | POA: Diagnosis not present

## 2016-11-20 DIAGNOSIS — I10 Essential (primary) hypertension: Secondary | ICD-10-CM | POA: Diagnosis not present

## 2016-11-24 DIAGNOSIS — Z952 Presence of prosthetic heart valve: Secondary | ICD-10-CM | POA: Diagnosis not present

## 2016-11-24 DIAGNOSIS — I482 Chronic atrial fibrillation: Secondary | ICD-10-CM | POA: Diagnosis not present

## 2016-11-24 DIAGNOSIS — R269 Unspecified abnormalities of gait and mobility: Secondary | ICD-10-CM | POA: Diagnosis not present

## 2016-11-24 DIAGNOSIS — M6281 Muscle weakness (generalized): Secondary | ICD-10-CM | POA: Diagnosis not present

## 2016-11-24 DIAGNOSIS — R262 Difficulty in walking, not elsewhere classified: Secondary | ICD-10-CM | POA: Diagnosis not present

## 2016-12-02 DIAGNOSIS — M6281 Muscle weakness (generalized): Secondary | ICD-10-CM | POA: Diagnosis not present

## 2016-12-02 DIAGNOSIS — R262 Difficulty in walking, not elsewhere classified: Secondary | ICD-10-CM | POA: Diagnosis not present

## 2016-12-02 DIAGNOSIS — R269 Unspecified abnormalities of gait and mobility: Secondary | ICD-10-CM | POA: Diagnosis not present

## 2016-12-08 DIAGNOSIS — R262 Difficulty in walking, not elsewhere classified: Secondary | ICD-10-CM | POA: Diagnosis not present

## 2016-12-08 DIAGNOSIS — M6281 Muscle weakness (generalized): Secondary | ICD-10-CM | POA: Diagnosis not present

## 2016-12-08 DIAGNOSIS — R269 Unspecified abnormalities of gait and mobility: Secondary | ICD-10-CM | POA: Diagnosis not present

## 2016-12-15 DIAGNOSIS — R269 Unspecified abnormalities of gait and mobility: Secondary | ICD-10-CM | POA: Diagnosis not present

## 2016-12-15 DIAGNOSIS — R262 Difficulty in walking, not elsewhere classified: Secondary | ICD-10-CM | POA: Diagnosis not present

## 2016-12-15 DIAGNOSIS — M6281 Muscle weakness (generalized): Secondary | ICD-10-CM | POA: Diagnosis not present

## 2016-12-21 DIAGNOSIS — I10 Essential (primary) hypertension: Secondary | ICD-10-CM | POA: Diagnosis not present

## 2016-12-22 DIAGNOSIS — Z952 Presence of prosthetic heart valve: Secondary | ICD-10-CM | POA: Diagnosis not present

## 2016-12-22 DIAGNOSIS — I482 Chronic atrial fibrillation: Secondary | ICD-10-CM | POA: Diagnosis not present

## 2016-12-22 DIAGNOSIS — R269 Unspecified abnormalities of gait and mobility: Secondary | ICD-10-CM | POA: Diagnosis not present

## 2016-12-22 DIAGNOSIS — M6281 Muscle weakness (generalized): Secondary | ICD-10-CM | POA: Diagnosis not present

## 2016-12-22 DIAGNOSIS — R262 Difficulty in walking, not elsewhere classified: Secondary | ICD-10-CM | POA: Diagnosis not present

## 2016-12-29 DIAGNOSIS — M6281 Muscle weakness (generalized): Secondary | ICD-10-CM | POA: Diagnosis not present

## 2016-12-29 DIAGNOSIS — R262 Difficulty in walking, not elsewhere classified: Secondary | ICD-10-CM | POA: Diagnosis not present

## 2016-12-29 DIAGNOSIS — R269 Unspecified abnormalities of gait and mobility: Secondary | ICD-10-CM | POA: Diagnosis not present

## 2017-01-05 DIAGNOSIS — R262 Difficulty in walking, not elsewhere classified: Secondary | ICD-10-CM | POA: Diagnosis not present

## 2017-01-05 DIAGNOSIS — M6281 Muscle weakness (generalized): Secondary | ICD-10-CM | POA: Diagnosis not present

## 2017-01-05 DIAGNOSIS — R269 Unspecified abnormalities of gait and mobility: Secondary | ICD-10-CM | POA: Diagnosis not present

## 2017-01-12 DIAGNOSIS — R269 Unspecified abnormalities of gait and mobility: Secondary | ICD-10-CM | POA: Diagnosis not present

## 2017-01-12 DIAGNOSIS — R262 Difficulty in walking, not elsewhere classified: Secondary | ICD-10-CM | POA: Diagnosis not present

## 2017-01-12 DIAGNOSIS — M6281 Muscle weakness (generalized): Secondary | ICD-10-CM | POA: Diagnosis not present

## 2017-01-19 DIAGNOSIS — I482 Chronic atrial fibrillation: Secondary | ICD-10-CM | POA: Diagnosis not present

## 2017-01-19 DIAGNOSIS — Z952 Presence of prosthetic heart valve: Secondary | ICD-10-CM | POA: Diagnosis not present

## 2017-01-20 DIAGNOSIS — I10 Essential (primary) hypertension: Secondary | ICD-10-CM | POA: Diagnosis not present

## 2017-02-09 DIAGNOSIS — R269 Unspecified abnormalities of gait and mobility: Secondary | ICD-10-CM | POA: Diagnosis not present

## 2017-02-09 DIAGNOSIS — R262 Difficulty in walking, not elsewhere classified: Secondary | ICD-10-CM | POA: Diagnosis not present

## 2017-02-09 DIAGNOSIS — M6281 Muscle weakness (generalized): Secondary | ICD-10-CM | POA: Diagnosis not present

## 2017-02-16 DIAGNOSIS — R262 Difficulty in walking, not elsewhere classified: Secondary | ICD-10-CM | POA: Diagnosis not present

## 2017-02-16 DIAGNOSIS — Z952 Presence of prosthetic heart valve: Secondary | ICD-10-CM | POA: Diagnosis not present

## 2017-02-16 DIAGNOSIS — M6281 Muscle weakness (generalized): Secondary | ICD-10-CM | POA: Diagnosis not present

## 2017-02-16 DIAGNOSIS — I482 Chronic atrial fibrillation: Secondary | ICD-10-CM | POA: Diagnosis not present

## 2017-02-16 DIAGNOSIS — R269 Unspecified abnormalities of gait and mobility: Secondary | ICD-10-CM | POA: Diagnosis not present

## 2017-02-20 DIAGNOSIS — I10 Essential (primary) hypertension: Secondary | ICD-10-CM | POA: Diagnosis not present

## 2017-02-23 DIAGNOSIS — M6281 Muscle weakness (generalized): Secondary | ICD-10-CM | POA: Diagnosis not present

## 2017-02-23 DIAGNOSIS — R269 Unspecified abnormalities of gait and mobility: Secondary | ICD-10-CM | POA: Diagnosis not present

## 2017-02-23 DIAGNOSIS — R262 Difficulty in walking, not elsewhere classified: Secondary | ICD-10-CM | POA: Diagnosis not present

## 2017-03-02 DIAGNOSIS — R262 Difficulty in walking, not elsewhere classified: Secondary | ICD-10-CM | POA: Diagnosis not present

## 2017-03-02 DIAGNOSIS — R269 Unspecified abnormalities of gait and mobility: Secondary | ICD-10-CM | POA: Diagnosis not present

## 2017-03-02 DIAGNOSIS — M6281 Muscle weakness (generalized): Secondary | ICD-10-CM | POA: Diagnosis not present

## 2017-03-05 DIAGNOSIS — I4891 Unspecified atrial fibrillation: Secondary | ICD-10-CM | POA: Diagnosis not present

## 2017-03-05 DIAGNOSIS — R69 Illness, unspecified: Secondary | ICD-10-CM | POA: Diagnosis not present

## 2017-03-05 DIAGNOSIS — I2510 Atherosclerotic heart disease of native coronary artery without angina pectoris: Secondary | ICD-10-CM | POA: Diagnosis not present

## 2017-03-05 DIAGNOSIS — E7800 Pure hypercholesterolemia, unspecified: Secondary | ICD-10-CM | POA: Diagnosis not present

## 2017-03-05 DIAGNOSIS — J9610 Chronic respiratory failure, unspecified whether with hypoxia or hypercapnia: Secondary | ICD-10-CM | POA: Diagnosis not present

## 2017-03-05 DIAGNOSIS — C50912 Malignant neoplasm of unspecified site of left female breast: Secondary | ICD-10-CM | POA: Diagnosis not present

## 2017-03-05 DIAGNOSIS — Z9181 History of falling: Secondary | ICD-10-CM | POA: Diagnosis not present

## 2017-03-05 DIAGNOSIS — Z952 Presence of prosthetic heart valve: Secondary | ICD-10-CM | POA: Diagnosis not present

## 2017-03-05 DIAGNOSIS — E669 Obesity, unspecified: Secondary | ICD-10-CM | POA: Diagnosis not present

## 2017-03-05 DIAGNOSIS — E039 Hypothyroidism, unspecified: Secondary | ICD-10-CM | POA: Diagnosis not present

## 2017-03-10 DIAGNOSIS — M6281 Muscle weakness (generalized): Secondary | ICD-10-CM | POA: Diagnosis not present

## 2017-03-10 DIAGNOSIS — R262 Difficulty in walking, not elsewhere classified: Secondary | ICD-10-CM | POA: Diagnosis not present

## 2017-03-10 DIAGNOSIS — R269 Unspecified abnormalities of gait and mobility: Secondary | ICD-10-CM | POA: Diagnosis not present

## 2017-03-16 DIAGNOSIS — R269 Unspecified abnormalities of gait and mobility: Secondary | ICD-10-CM | POA: Diagnosis not present

## 2017-03-16 DIAGNOSIS — M6281 Muscle weakness (generalized): Secondary | ICD-10-CM | POA: Diagnosis not present

## 2017-03-16 DIAGNOSIS — Z952 Presence of prosthetic heart valve: Secondary | ICD-10-CM | POA: Diagnosis not present

## 2017-03-16 DIAGNOSIS — I482 Chronic atrial fibrillation: Secondary | ICD-10-CM | POA: Diagnosis not present

## 2017-03-16 DIAGNOSIS — R262 Difficulty in walking, not elsewhere classified: Secondary | ICD-10-CM | POA: Diagnosis not present

## 2017-03-23 DIAGNOSIS — I10 Essential (primary) hypertension: Secondary | ICD-10-CM | POA: Diagnosis not present

## 2017-03-31 DIAGNOSIS — M6281 Muscle weakness (generalized): Secondary | ICD-10-CM | POA: Diagnosis not present

## 2017-03-31 DIAGNOSIS — R269 Unspecified abnormalities of gait and mobility: Secondary | ICD-10-CM | POA: Diagnosis not present

## 2017-03-31 DIAGNOSIS — R262 Difficulty in walking, not elsewhere classified: Secondary | ICD-10-CM | POA: Diagnosis not present

## 2017-04-06 DIAGNOSIS — M6281 Muscle weakness (generalized): Secondary | ICD-10-CM | POA: Diagnosis not present

## 2017-04-06 DIAGNOSIS — R262 Difficulty in walking, not elsewhere classified: Secondary | ICD-10-CM | POA: Diagnosis not present

## 2017-04-06 DIAGNOSIS — R269 Unspecified abnormalities of gait and mobility: Secondary | ICD-10-CM | POA: Diagnosis not present

## 2017-04-08 DIAGNOSIS — Z923 Personal history of irradiation: Secondary | ICD-10-CM | POA: Diagnosis not present

## 2017-04-08 DIAGNOSIS — Z853 Personal history of malignant neoplasm of breast: Secondary | ICD-10-CM | POA: Diagnosis not present

## 2017-04-08 DIAGNOSIS — Z79811 Long term (current) use of aromatase inhibitors: Secondary | ICD-10-CM | POA: Diagnosis not present

## 2017-04-08 DIAGNOSIS — Z170 Estrogen receptor positive status [ER+]: Secondary | ICD-10-CM | POA: Diagnosis not present

## 2017-04-08 DIAGNOSIS — M810 Age-related osteoporosis without current pathological fracture: Secondary | ICD-10-CM | POA: Diagnosis not present

## 2017-04-13 DIAGNOSIS — M6281 Muscle weakness (generalized): Secondary | ICD-10-CM | POA: Diagnosis not present

## 2017-04-13 DIAGNOSIS — Z952 Presence of prosthetic heart valve: Secondary | ICD-10-CM | POA: Diagnosis not present

## 2017-04-13 DIAGNOSIS — I482 Chronic atrial fibrillation: Secondary | ICD-10-CM | POA: Diagnosis not present

## 2017-04-13 DIAGNOSIS — R269 Unspecified abnormalities of gait and mobility: Secondary | ICD-10-CM | POA: Diagnosis not present

## 2017-04-13 DIAGNOSIS — R262 Difficulty in walking, not elsewhere classified: Secondary | ICD-10-CM | POA: Diagnosis not present

## 2017-04-20 DIAGNOSIS — M6281 Muscle weakness (generalized): Secondary | ICD-10-CM | POA: Diagnosis not present

## 2017-04-20 DIAGNOSIS — R269 Unspecified abnormalities of gait and mobility: Secondary | ICD-10-CM | POA: Diagnosis not present

## 2017-04-20 DIAGNOSIS — R262 Difficulty in walking, not elsewhere classified: Secondary | ICD-10-CM | POA: Diagnosis not present

## 2017-04-22 DIAGNOSIS — I10 Essential (primary) hypertension: Secondary | ICD-10-CM | POA: Diagnosis not present

## 2017-04-27 DIAGNOSIS — M6281 Muscle weakness (generalized): Secondary | ICD-10-CM | POA: Diagnosis not present

## 2017-04-27 DIAGNOSIS — R262 Difficulty in walking, not elsewhere classified: Secondary | ICD-10-CM | POA: Diagnosis not present

## 2017-04-27 DIAGNOSIS — R269 Unspecified abnormalities of gait and mobility: Secondary | ICD-10-CM | POA: Diagnosis not present

## 2017-04-30 DIAGNOSIS — Z23 Encounter for immunization: Secondary | ICD-10-CM | POA: Diagnosis not present

## 2017-05-04 DIAGNOSIS — M6281 Muscle weakness (generalized): Secondary | ICD-10-CM | POA: Diagnosis not present

## 2017-05-04 DIAGNOSIS — R262 Difficulty in walking, not elsewhere classified: Secondary | ICD-10-CM | POA: Diagnosis not present

## 2017-05-04 DIAGNOSIS — R269 Unspecified abnormalities of gait and mobility: Secondary | ICD-10-CM | POA: Diagnosis not present

## 2017-05-05 DIAGNOSIS — H5211 Myopia, right eye: Secondary | ICD-10-CM | POA: Diagnosis not present

## 2017-05-06 DIAGNOSIS — M810 Age-related osteoporosis without current pathological fracture: Secondary | ICD-10-CM | POA: Diagnosis not present

## 2017-05-06 DIAGNOSIS — C50812 Malignant neoplasm of overlapping sites of left female breast: Secondary | ICD-10-CM | POA: Diagnosis not present

## 2017-05-11 DIAGNOSIS — M6281 Muscle weakness (generalized): Secondary | ICD-10-CM | POA: Diagnosis not present

## 2017-05-11 DIAGNOSIS — R269 Unspecified abnormalities of gait and mobility: Secondary | ICD-10-CM | POA: Diagnosis not present

## 2017-05-11 DIAGNOSIS — R262 Difficulty in walking, not elsewhere classified: Secondary | ICD-10-CM | POA: Diagnosis not present

## 2017-05-18 DIAGNOSIS — R791 Abnormal coagulation profile: Secondary | ICD-10-CM | POA: Diagnosis not present

## 2017-05-23 DIAGNOSIS — I10 Essential (primary) hypertension: Secondary | ICD-10-CM | POA: Diagnosis not present

## 2017-05-25 DIAGNOSIS — I4891 Unspecified atrial fibrillation: Secondary | ICD-10-CM | POA: Diagnosis not present

## 2017-05-25 DIAGNOSIS — M810 Age-related osteoporosis without current pathological fracture: Secondary | ICD-10-CM | POA: Diagnosis not present

## 2017-05-25 DIAGNOSIS — M8589 Other specified disorders of bone density and structure, multiple sites: Secondary | ICD-10-CM | POA: Diagnosis not present

## 2017-05-29 DIAGNOSIS — I4891 Unspecified atrial fibrillation: Secondary | ICD-10-CM | POA: Diagnosis not present

## 2017-06-01 DIAGNOSIS — R262 Difficulty in walking, not elsewhere classified: Secondary | ICD-10-CM | POA: Diagnosis not present

## 2017-06-01 DIAGNOSIS — M6281 Muscle weakness (generalized): Secondary | ICD-10-CM | POA: Diagnosis not present

## 2017-06-01 DIAGNOSIS — R269 Unspecified abnormalities of gait and mobility: Secondary | ICD-10-CM | POA: Diagnosis not present

## 2017-06-04 ENCOUNTER — Other Ambulatory Visit: Payer: Self-pay | Admitting: Oncology

## 2017-06-04 DIAGNOSIS — Z9889 Other specified postprocedural states: Secondary | ICD-10-CM

## 2017-06-04 DIAGNOSIS — Z853 Personal history of malignant neoplasm of breast: Secondary | ICD-10-CM

## 2017-06-08 DIAGNOSIS — I482 Chronic atrial fibrillation: Secondary | ICD-10-CM | POA: Diagnosis not present

## 2017-06-08 DIAGNOSIS — Z952 Presence of prosthetic heart valve: Secondary | ICD-10-CM | POA: Diagnosis not present

## 2017-06-19 DIAGNOSIS — N3281 Overactive bladder: Secondary | ICD-10-CM | POA: Diagnosis not present

## 2017-06-19 DIAGNOSIS — Z01411 Encounter for gynecological examination (general) (routine) with abnormal findings: Secondary | ICD-10-CM | POA: Diagnosis not present

## 2017-06-19 DIAGNOSIS — L309 Dermatitis, unspecified: Secondary | ICD-10-CM | POA: Diagnosis not present

## 2017-06-22 DIAGNOSIS — I10 Essential (primary) hypertension: Secondary | ICD-10-CM | POA: Diagnosis not present

## 2017-07-06 DIAGNOSIS — Z952 Presence of prosthetic heart valve: Secondary | ICD-10-CM | POA: Diagnosis not present

## 2017-07-06 DIAGNOSIS — I482 Chronic atrial fibrillation: Secondary | ICD-10-CM | POA: Diagnosis not present

## 2017-07-15 DIAGNOSIS — I2510 Atherosclerotic heart disease of native coronary artery without angina pectoris: Secondary | ICD-10-CM | POA: Diagnosis not present

## 2017-07-15 DIAGNOSIS — I4891 Unspecified atrial fibrillation: Secondary | ICD-10-CM | POA: Diagnosis not present

## 2017-07-15 DIAGNOSIS — Z952 Presence of prosthetic heart valve: Secondary | ICD-10-CM | POA: Diagnosis not present

## 2017-07-15 DIAGNOSIS — Z79899 Other long term (current) drug therapy: Secondary | ICD-10-CM | POA: Diagnosis not present

## 2017-07-15 DIAGNOSIS — E7800 Pure hypercholesterolemia, unspecified: Secondary | ICD-10-CM | POA: Diagnosis not present

## 2017-07-15 DIAGNOSIS — N390 Urinary tract infection, site not specified: Secondary | ICD-10-CM | POA: Diagnosis not present

## 2017-07-15 DIAGNOSIS — Z1331 Encounter for screening for depression: Secondary | ICD-10-CM | POA: Diagnosis not present

## 2017-07-15 DIAGNOSIS — M810 Age-related osteoporosis without current pathological fracture: Secondary | ICD-10-CM | POA: Diagnosis not present

## 2017-07-15 DIAGNOSIS — N3281 Overactive bladder: Secondary | ICD-10-CM | POA: Diagnosis not present

## 2017-07-15 DIAGNOSIS — E039 Hypothyroidism, unspecified: Secondary | ICD-10-CM | POA: Diagnosis not present

## 2017-07-15 DIAGNOSIS — N61 Mastitis without abscess: Secondary | ICD-10-CM | POA: Diagnosis not present

## 2017-07-15 DIAGNOSIS — Z9181 History of falling: Secondary | ICD-10-CM | POA: Diagnosis not present

## 2017-07-23 DIAGNOSIS — I10 Essential (primary) hypertension: Secondary | ICD-10-CM | POA: Diagnosis not present

## 2017-08-03 DIAGNOSIS — Z952 Presence of prosthetic heart valve: Secondary | ICD-10-CM | POA: Diagnosis not present

## 2017-08-03 DIAGNOSIS — I482 Chronic atrial fibrillation: Secondary | ICD-10-CM | POA: Diagnosis not present

## 2017-08-23 DIAGNOSIS — I10 Essential (primary) hypertension: Secondary | ICD-10-CM | POA: Diagnosis not present

## 2017-08-31 DIAGNOSIS — I482 Chronic atrial fibrillation: Secondary | ICD-10-CM | POA: Diagnosis not present

## 2017-08-31 DIAGNOSIS — Z952 Presence of prosthetic heart valve: Secondary | ICD-10-CM | POA: Diagnosis not present

## 2017-09-20 DIAGNOSIS — I10 Essential (primary) hypertension: Secondary | ICD-10-CM | POA: Diagnosis not present

## 2017-09-28 DIAGNOSIS — I482 Chronic atrial fibrillation: Secondary | ICD-10-CM | POA: Diagnosis not present

## 2017-09-28 DIAGNOSIS — Z952 Presence of prosthetic heart valve: Secondary | ICD-10-CM | POA: Diagnosis not present

## 2017-10-21 DIAGNOSIS — I10 Essential (primary) hypertension: Secondary | ICD-10-CM | POA: Diagnosis not present

## 2017-10-26 DIAGNOSIS — I482 Chronic atrial fibrillation: Secondary | ICD-10-CM | POA: Diagnosis not present

## 2017-10-26 DIAGNOSIS — Z952 Presence of prosthetic heart valve: Secondary | ICD-10-CM | POA: Diagnosis not present

## 2017-11-12 DIAGNOSIS — E7800 Pure hypercholesterolemia, unspecified: Secondary | ICD-10-CM | POA: Diagnosis not present

## 2017-11-12 DIAGNOSIS — Z952 Presence of prosthetic heart valve: Secondary | ICD-10-CM | POA: Diagnosis not present

## 2017-11-12 DIAGNOSIS — I4891 Unspecified atrial fibrillation: Secondary | ICD-10-CM | POA: Diagnosis not present

## 2017-11-12 DIAGNOSIS — R69 Illness, unspecified: Secondary | ICD-10-CM | POA: Diagnosis not present

## 2017-11-12 DIAGNOSIS — E039 Hypothyroidism, unspecified: Secondary | ICD-10-CM | POA: Diagnosis not present

## 2017-11-12 DIAGNOSIS — M810 Age-related osteoporosis without current pathological fracture: Secondary | ICD-10-CM | POA: Diagnosis not present

## 2017-11-12 DIAGNOSIS — Z79899 Other long term (current) drug therapy: Secondary | ICD-10-CM | POA: Diagnosis not present

## 2017-11-12 DIAGNOSIS — I2510 Atherosclerotic heart disease of native coronary artery without angina pectoris: Secondary | ICD-10-CM | POA: Diagnosis not present

## 2017-11-12 DIAGNOSIS — N390 Urinary tract infection, site not specified: Secondary | ICD-10-CM | POA: Diagnosis not present

## 2017-11-12 DIAGNOSIS — Z853 Personal history of malignant neoplasm of breast: Secondary | ICD-10-CM | POA: Diagnosis not present

## 2017-11-20 DIAGNOSIS — I10 Essential (primary) hypertension: Secondary | ICD-10-CM | POA: Diagnosis not present

## 2017-11-23 DIAGNOSIS — Z952 Presence of prosthetic heart valve: Secondary | ICD-10-CM | POA: Diagnosis not present

## 2017-11-23 DIAGNOSIS — I482 Chronic atrial fibrillation: Secondary | ICD-10-CM | POA: Diagnosis not present

## 2017-11-27 DIAGNOSIS — E875 Hyperkalemia: Secondary | ICD-10-CM | POA: Diagnosis not present

## 2017-12-21 DIAGNOSIS — I10 Essential (primary) hypertension: Secondary | ICD-10-CM | POA: Diagnosis not present

## 2017-12-21 DIAGNOSIS — I482 Chronic atrial fibrillation: Secondary | ICD-10-CM | POA: Diagnosis not present

## 2017-12-21 DIAGNOSIS — Z952 Presence of prosthetic heart valve: Secondary | ICD-10-CM | POA: Diagnosis not present

## 2017-12-29 DIAGNOSIS — M810 Age-related osteoporosis without current pathological fracture: Secondary | ICD-10-CM | POA: Diagnosis not present

## 2018-01-18 DIAGNOSIS — Z952 Presence of prosthetic heart valve: Secondary | ICD-10-CM | POA: Diagnosis not present

## 2018-01-18 DIAGNOSIS — I482 Chronic atrial fibrillation: Secondary | ICD-10-CM | POA: Diagnosis not present

## 2018-01-20 DIAGNOSIS — I10 Essential (primary) hypertension: Secondary | ICD-10-CM | POA: Diagnosis not present

## 2018-01-29 ENCOUNTER — Other Ambulatory Visit: Payer: Self-pay | Admitting: Oncology

## 2018-02-15 DIAGNOSIS — Z952 Presence of prosthetic heart valve: Secondary | ICD-10-CM | POA: Diagnosis not present

## 2018-02-15 DIAGNOSIS — I482 Chronic atrial fibrillation: Secondary | ICD-10-CM | POA: Diagnosis not present

## 2018-02-20 DIAGNOSIS — I10 Essential (primary) hypertension: Secondary | ICD-10-CM | POA: Diagnosis not present

## 2018-03-15 DIAGNOSIS — Z952 Presence of prosthetic heart valve: Secondary | ICD-10-CM | POA: Diagnosis not present

## 2018-03-15 DIAGNOSIS — I482 Chronic atrial fibrillation: Secondary | ICD-10-CM | POA: Diagnosis not present

## 2018-03-17 DIAGNOSIS — Z1339 Encounter for screening examination for other mental health and behavioral disorders: Secondary | ICD-10-CM | POA: Diagnosis not present

## 2018-03-17 DIAGNOSIS — Z952 Presence of prosthetic heart valve: Secondary | ICD-10-CM | POA: Diagnosis not present

## 2018-03-17 DIAGNOSIS — R69 Illness, unspecified: Secondary | ICD-10-CM | POA: Diagnosis not present

## 2018-03-17 DIAGNOSIS — Z6832 Body mass index (BMI) 32.0-32.9, adult: Secondary | ICD-10-CM | POA: Diagnosis not present

## 2018-03-17 DIAGNOSIS — E039 Hypothyroidism, unspecified: Secondary | ICD-10-CM | POA: Diagnosis not present

## 2018-03-17 DIAGNOSIS — M810 Age-related osteoporosis without current pathological fracture: Secondary | ICD-10-CM | POA: Diagnosis not present

## 2018-03-17 DIAGNOSIS — E7800 Pure hypercholesterolemia, unspecified: Secondary | ICD-10-CM | POA: Diagnosis not present

## 2018-03-17 DIAGNOSIS — I2510 Atherosclerotic heart disease of native coronary artery without angina pectoris: Secondary | ICD-10-CM | POA: Diagnosis not present

## 2018-03-17 DIAGNOSIS — Z79899 Other long term (current) drug therapy: Secondary | ICD-10-CM | POA: Diagnosis not present

## 2018-03-17 DIAGNOSIS — Z23 Encounter for immunization: Secondary | ICD-10-CM | POA: Diagnosis not present

## 2018-03-17 DIAGNOSIS — I4891 Unspecified atrial fibrillation: Secondary | ICD-10-CM | POA: Diagnosis not present

## 2018-03-23 DIAGNOSIS — I10 Essential (primary) hypertension: Secondary | ICD-10-CM | POA: Diagnosis not present

## 2018-04-12 DIAGNOSIS — Z952 Presence of prosthetic heart valve: Secondary | ICD-10-CM | POA: Diagnosis not present

## 2018-04-22 DIAGNOSIS — I10 Essential (primary) hypertension: Secondary | ICD-10-CM | POA: Diagnosis not present

## 2018-05-10 DIAGNOSIS — Z952 Presence of prosthetic heart valve: Secondary | ICD-10-CM | POA: Diagnosis not present

## 2018-05-23 DIAGNOSIS — I10 Essential (primary) hypertension: Secondary | ICD-10-CM | POA: Diagnosis not present

## 2018-06-07 DIAGNOSIS — Z952 Presence of prosthetic heart valve: Secondary | ICD-10-CM | POA: Diagnosis not present

## 2018-06-11 DIAGNOSIS — M810 Age-related osteoporosis without current pathological fracture: Secondary | ICD-10-CM | POA: Diagnosis not present

## 2018-06-22 DIAGNOSIS — I10 Essential (primary) hypertension: Secondary | ICD-10-CM | POA: Diagnosis not present

## 2018-07-05 DIAGNOSIS — Z952 Presence of prosthetic heart valve: Secondary | ICD-10-CM | POA: Diagnosis not present

## 2018-07-07 ENCOUNTER — Emergency Department (HOSPITAL_COMMUNITY): Payer: Medicare HMO

## 2018-07-07 ENCOUNTER — Other Ambulatory Visit: Payer: Self-pay

## 2018-07-07 ENCOUNTER — Emergency Department (HOSPITAL_COMMUNITY)
Admission: EM | Admit: 2018-07-07 | Discharge: 2018-07-08 | Disposition: A | Discharge status: Home / Self Care | Payer: Medicare HMO | Attending: Emergency Medicine | Admitting: Emergency Medicine

## 2018-07-07 DIAGNOSIS — I2510 Atherosclerotic heart disease of native coronary artery without angina pectoris: Secondary | ICD-10-CM | POA: Insufficient documentation

## 2018-07-07 DIAGNOSIS — I10 Essential (primary) hypertension: Secondary | ICD-10-CM | POA: Diagnosis not present

## 2018-07-07 DIAGNOSIS — Z7982 Long term (current) use of aspirin: Secondary | ICD-10-CM | POA: Insufficient documentation

## 2018-07-07 DIAGNOSIS — Z79899 Other long term (current) drug therapy: Secondary | ICD-10-CM | POA: Insufficient documentation

## 2018-07-07 DIAGNOSIS — J181 Lobar pneumonia, unspecified organism: Secondary | ICD-10-CM | POA: Insufficient documentation

## 2018-07-07 DIAGNOSIS — R0600 Dyspnea, unspecified: Secondary | ICD-10-CM | POA: Diagnosis not present

## 2018-07-07 DIAGNOSIS — R05 Cough: Secondary | ICD-10-CM | POA: Diagnosis not present

## 2018-07-07 DIAGNOSIS — R0602 Shortness of breath: Secondary | ICD-10-CM | POA: Diagnosis not present

## 2018-07-07 DIAGNOSIS — J189 Pneumonia, unspecified organism: Secondary | ICD-10-CM

## 2018-07-07 LAB — CBC WITH DIFFERENTIAL/PLATELET
Abs Immature Granulocytes: 0.11 10*3/uL — ABNORMAL HIGH (ref 0.00–0.07)
Basophils Absolute: 0 10*3/uL (ref 0.0–0.1)
Basophils Relative: 0 %
EOS PCT: 0 %
Eosinophils Absolute: 0.1 10*3/uL (ref 0.0–0.5)
HCT: 38.4 % (ref 36.0–46.0)
HEMOGLOBIN: 12.5 g/dL (ref 12.0–15.0)
Immature Granulocytes: 1 %
LYMPHS PCT: 9 %
Lymphs Abs: 1.3 10*3/uL (ref 0.7–4.0)
MCH: 29.7 pg (ref 26.0–34.0)
MCHC: 32.6 g/dL (ref 30.0–36.0)
MCV: 91.2 fL (ref 80.0–100.0)
MONO ABS: 0.9 10*3/uL (ref 0.1–1.0)
Monocytes Relative: 6 %
NRBC: 0 % (ref 0.0–0.2)
Neutro Abs: 11.8 10*3/uL — ABNORMAL HIGH (ref 1.7–7.7)
Neutrophils Relative %: 84 %
Platelets: 219 10*3/uL (ref 150–400)
RBC: 4.21 MIL/uL (ref 3.87–5.11)
RDW: 13.8 % (ref 11.5–15.5)
WBC: 14.2 10*3/uL — AB (ref 4.0–10.5)

## 2018-07-07 LAB — COMPREHENSIVE METABOLIC PANEL
ALT: 11 U/L (ref 0–44)
ANION GAP: 10 (ref 5–15)
AST: 45 U/L — ABNORMAL HIGH (ref 15–41)
Albumin: 3.6 g/dL (ref 3.5–5.0)
Alkaline Phosphatase: 77 U/L (ref 38–126)
BUN: 18 mg/dL (ref 8–23)
CO2: 21 mmol/L — ABNORMAL LOW (ref 22–32)
Calcium: 9 mg/dL (ref 8.9–10.3)
Chloride: 102 mmol/L (ref 98–111)
Creatinine, Ser: 0.78 mg/dL (ref 0.44–1.00)
GFR calc non Af Amer: >60 mL/min (ref >60)
Glucose, Bld: 126 mg/dL — ABNORMAL HIGH (ref 70–99)
POTASSIUM: 4.5 mmol/L (ref 3.5–5.1)
Sodium: 133 mmol/L — ABNORMAL LOW (ref 135–145)
Total Bilirubin: 1 mg/dL (ref 0.3–1.2)
Total Protein: 7 g/dL (ref 6.5–8.1)

## 2018-07-07 LAB — I-STAT VENOUS BLOOD GAS, ED
Acid-Base Excess: 2 mmol/L (ref 0.0–2.0)
BICARBONATE: 28.7 mmol/L — AB (ref 20.0–28.0)
O2 SAT: 86 %
TCO2: 30 mmol/L (ref 22–32)
pCO2, Ven: 52 mmHg (ref 44.0–60.0)
pH, Ven: 7.35 (ref 7.250–7.430)
pO2, Ven: 55 mmHg — ABNORMAL HIGH (ref 32.0–45.0)

## 2018-07-07 LAB — PROTIME-INR
INR: 2.61
PROTHROMBIN TIME: 27.5 s — AB (ref 11.4–15.2)

## 2018-07-07 LAB — INFLUENZA PANEL BY PCR (TYPE A & B)
Influenza A By PCR: NEGATIVE
Influenza B By PCR: NEGATIVE

## 2018-07-07 LAB — I-STAT TROPONIN, ED: Troponin i, poc: 0.01 ng/mL (ref 0.00–0.08)

## 2018-07-07 MED ORDER — LEVOFLOXACIN IN D5W 750 MG/150ML IV SOLN: 750.0000 mg | Freq: Once | INTRAVENOUS | Status: DC

## 2018-07-07 MED ORDER — IPRATROPIUM BROMIDE 0.02 % IN SOLN
0.5000 mg | Freq: Once | RESPIRATORY_TRACT | Status: AC
  Administered 2018-07-07: 0.5 mg via RESPIRATORY_TRACT
  Filled 2018-07-07: qty 2.5

## 2018-07-07 MED ORDER — ALBUTEROL SULFATE (2.5 MG/3ML) 0.083% IN NEBU
5.0000 mg | INHALATION_SOLUTION | Freq: Once | RESPIRATORY_TRACT | Status: AC
  Administered 2018-07-07: 5 mg via RESPIRATORY_TRACT
  Filled 2018-07-07: qty 6

## 2018-07-07 MED ORDER — LEVOFLOXACIN IN D5W 750 MG/150ML IV SOLN
750.0000 mg | Freq: Once | INTRAVENOUS | Status: AC
  Administered 2018-07-07: 750 mg via INTRAVENOUS
  Filled 2018-07-07: qty 150

## 2018-07-07 MED ORDER — LEVOFLOXACIN 500 MG PO TABS: 500.0000 mg | ORAL_TABLET | Freq: Every day | ORAL | 0 refills | Status: AC

## 2018-07-07 MED ORDER — LEVOFLOXACIN 750 MG PO TABS: 750.0000 mg | ORAL_TABLET | Freq: Once | ORAL | Status: DC

## 2018-07-07 MED ORDER — CEPHALEXIN 250 MG PO CAPS: 500.0000 mg | ORAL_CAPSULE | Freq: Once | ORAL | Status: DC

## 2018-07-07 NOTE — ED Notes (Signed)
IV team consulted- 

## 2018-07-07 NOTE — Discharge Instructions (Signed)
Take levaquin 500 mg daily daily for a week for pneumonia.   Levaquin may affect your coumadin level. Recheck coumadin next week   See your doctor  Return to ER if you have worse shortness of breath, chest pain, fever, trouble breathing

## 2018-07-07 NOTE — ED Notes (Signed)
Attempted IV x2. 

## 2018-07-07 NOTE — ED Triage Notes (Signed)
Pt brought in by [facility]. Per pt she has had a cough and congestion for about two days now. Pt reports that she had difficulty breathing as well that worsened today. Per EMS pt was 86% on room air upon their arrival. Pt was 100% on 2L [place] for EMS. Pt denies any pain. Pt normally wears 2L [place] at night time only.
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
## 2018-08-07 NOTE — ED Provider Notes (Signed)
This is to clarify the goals of care discussion with patient and husband. Since patient had previous respiratory failure requiring trach, peg, she doesn't want to be intubated. She had prolonged course in the hospital and was in a nursing home previously. She wants to go home if possible. Since she is only on oxygen as needed and required more oxygen in the ED, I did suggest admission. However, she wants to go home if possible. Her WBC is 14 and CXR showed possible pleural effusion. Flu negative. Clinically, she has pneumonia. Since her oxygen is 100% 3L [place] and she is breathing comfortably, I think its reasonable to discharge her home. She received a dose of IV levaquin in the ED. She has oxygen at home. Gave strict return precautions.    Drenda Freeze, MD 07/09/18 [phone]

## 2018-08-07 NOTE — ED Notes (Addendum)
Patient' husband asked if we had portable O2 tanks incase she become SOB on the way home. Informed patient and her husband we do not offer tanks like that to take home. Checked patient's O2 sats on RA, maintained at 99%. Patient states she felt she can make in the car ride home and use her home O2 tank.   Patient verbalizes understanding of medications and discharge instructions. No further questions at this time. VSS and patient ambulatory at discharge.

## 2018-08-07 DEATH — deceased
